# Patient Record
Sex: Female | Born: 1980 | Race: Black or African American | Hispanic: No | Marital: Married | Smoking: Never smoker
Health system: Southern US, Community
[De-identification: ages and names within clinical notes are randomized; demographics above are authoritative.]

## PROBLEM LIST (undated history)

## (undated) DIAGNOSIS — Z8719 Personal history of other diseases of the digestive system: Secondary | ICD-10-CM

## (undated) DIAGNOSIS — R51 Headache: Secondary | ICD-10-CM

## (undated) DIAGNOSIS — R519 Headache, unspecified: Secondary | ICD-10-CM

## (undated) DIAGNOSIS — M659 Synovitis and tenosynovitis, unspecified: Secondary | ICD-10-CM

## (undated) DIAGNOSIS — M25862 Other specified joint disorders, left knee: Secondary | ICD-10-CM

## (undated) DIAGNOSIS — M25869 Other specified joint disorders, unspecified knee: Secondary | ICD-10-CM

## (undated) DIAGNOSIS — Z8711 Personal history of peptic ulcer disease: Secondary | ICD-10-CM

## (undated) HISTORY — PX: UPPER GI ENDOSCOPY: SHX6162

---

## 1999-06-02 ENCOUNTER — Inpatient Hospital Stay (HOSPITAL_COMMUNITY): Admission: EM | Admit: 1999-06-02 | Discharge: 1999-06-04 | Payer: Self-pay | Admitting: Emergency Medicine

## 2002-04-08 ENCOUNTER — Emergency Department (HOSPITAL_COMMUNITY): Admission: EM | Admit: 2002-04-08 | Discharge: 2002-04-08 | Payer: Self-pay | Admitting: Emergency Medicine

## 2002-09-19 ENCOUNTER — Emergency Department (HOSPITAL_COMMUNITY): Admission: EM | Admit: 2002-09-19 | Discharge: 2002-09-20 | Payer: Self-pay

## 2002-09-20 ENCOUNTER — Ambulatory Visit (HOSPITAL_COMMUNITY): Admission: RE | Admit: 2002-09-20 | Discharge: 2002-09-20 | Payer: Self-pay

## 2003-01-10 ENCOUNTER — Encounter: Admission: RE | Admit: 2003-01-10 | Discharge: 2003-01-10 | Payer: Self-pay | Admitting: Family Medicine

## 2006-12-17 DIAGNOSIS — M545 Low back pain, unspecified: Secondary | ICD-10-CM | POA: Insufficient documentation

## 2007-07-02 ENCOUNTER — Emergency Department (HOSPITAL_COMMUNITY): Admission: EM | Admit: 2007-07-02 | Discharge: 2007-07-02 | Payer: Self-pay | Admitting: Family Medicine

## 2008-07-03 ENCOUNTER — Emergency Department (HOSPITAL_COMMUNITY): Admission: EM | Admit: 2008-07-03 | Discharge: 2008-07-03 | Payer: Self-pay | Admitting: Emergency Medicine

## 2009-07-17 ENCOUNTER — Emergency Department (HOSPITAL_COMMUNITY): Admission: EM | Admit: 2009-07-17 | Discharge: 2009-07-17 | Payer: Self-pay | Admitting: Family Medicine

## 2010-05-28 ENCOUNTER — Other Ambulatory Visit: Admission: RE | Admit: 2010-05-28 | Discharge: 2010-05-28 | Payer: Self-pay | Admitting: Obstetrics and Gynecology

## 2012-07-21 ENCOUNTER — Other Ambulatory Visit (HOSPITAL_COMMUNITY)
Admission: RE | Admit: 2012-07-21 | Discharge: 2012-07-21 | Disposition: A | Discharge status: Home / Self Care | Payer: Self-pay | Source: Ambulatory Visit | Attending: Obstetrics and Gynecology | Admitting: Obstetrics and Gynecology

## 2012-07-21 ENCOUNTER — Other Ambulatory Visit: Payer: Self-pay | Admitting: Obstetrics and Gynecology

## 2012-07-21 DIAGNOSIS — Z01419 Encounter for gynecological examination (general) (routine) without abnormal findings: Secondary | ICD-10-CM | POA: Insufficient documentation

## 2013-07-25 ENCOUNTER — Other Ambulatory Visit (HOSPITAL_COMMUNITY)
Admission: RE | Admit: 2013-07-25 | Discharge: 2013-07-25 | Disposition: A | Discharge status: Home / Self Care | Payer: Self-pay | Source: Ambulatory Visit | Attending: Obstetrics and Gynecology | Admitting: Obstetrics and Gynecology

## 2013-07-25 ENCOUNTER — Other Ambulatory Visit: Payer: Self-pay | Admitting: Obstetrics and Gynecology

## 2013-07-25 DIAGNOSIS — Z1151 Encounter for screening for human papillomavirus (HPV): Secondary | ICD-10-CM | POA: Insufficient documentation

## 2013-07-25 DIAGNOSIS — Z01419 Encounter for gynecological examination (general) (routine) without abnormal findings: Secondary | ICD-10-CM | POA: Insufficient documentation

## 2013-11-18 ENCOUNTER — Encounter (HOSPITAL_COMMUNITY): Payer: Self-pay | Admitting: Emergency Medicine

## 2013-11-18 ENCOUNTER — Emergency Department (HOSPITAL_COMMUNITY): Payer: Self-pay

## 2013-11-18 ENCOUNTER — Emergency Department (HOSPITAL_COMMUNITY)
Admission: EM | Admit: 2013-11-18 | Discharge: 2013-11-18 | Disposition: A | Discharge status: Home / Self Care | Payer: Self-pay | Attending: Emergency Medicine | Admitting: Emergency Medicine

## 2013-11-18 DIAGNOSIS — S93409A Sprain of unspecified ligament of unspecified ankle, initial encounter: Secondary | ICD-10-CM | POA: Insufficient documentation

## 2013-11-18 DIAGNOSIS — Y939 Activity, unspecified: Secondary | ICD-10-CM | POA: Insufficient documentation

## 2013-11-18 DIAGNOSIS — Y929 Unspecified place or not applicable: Secondary | ICD-10-CM | POA: Insufficient documentation

## 2013-11-18 DIAGNOSIS — S93402A Sprain of unspecified ligament of left ankle, initial encounter: Secondary | ICD-10-CM

## 2013-11-18 DIAGNOSIS — X58XXXA Exposure to other specified factors, initial encounter: Secondary | ICD-10-CM | POA: Insufficient documentation

## 2013-11-18 DIAGNOSIS — R209 Unspecified disturbances of skin sensation: Secondary | ICD-10-CM | POA: Insufficient documentation

## 2013-11-18 DIAGNOSIS — Z8719 Personal history of other diseases of the digestive system: Secondary | ICD-10-CM | POA: Insufficient documentation

## 2013-11-18 MED ORDER — HYDROCODONE-ACETAMINOPHEN 5-325 MG PO TABS
1.0000 | ORAL_TABLET | Freq: Once | ORAL | Status: AC
  Administered 2013-11-18: 1 via ORAL
  Filled 2013-11-18: qty 1

## 2013-11-18 MED ORDER — HYDROCODONE-ACETAMINOPHEN 5-325 MG PO TABS: 1.0000 | ORAL_TABLET | ORAL | Status: DC | PRN

## 2013-11-18 NOTE — Discharge Instructions (Signed)
Ankle Sprain °An ankle sprain is an injury to the strong, fibrous tissues (ligaments) that hold the bones of your ankle joint together.  °CAUSES °An ankle sprain is usually caused by a fall or by twisting your ankle. Ankle sprains most commonly occur when you step on the outer edge of your foot, and your ankle turns inward. People who participate in sports are more prone to these types of injuries.  °SYMPTOMS  °· Pain in your ankle. The pain may be present at rest or only when you are trying to stand or walk. °· Swelling. °· Bruising. Bruising may develop immediately or within 1 to 2 days after your injury. °· Difficulty standing or walking, particularly when turning corners or changing directions. °DIAGNOSIS  °Your caregiver will ask you details about your injury and perform a physical exam of your ankle to determine if you have an ankle sprain. During the physical exam, your caregiver will press on and apply pressure to specific areas of your foot and ankle. Your caregiver will try to move your ankle in certain ways. An X-ray exam may be done to be sure a bone was not broken or a ligament did not separate from one of the bones in your ankle (avulsion fracture).  °TREATMENT  °Certain types of braces can help stabilize your ankle. Your caregiver can make a recommendation for this. Your caregiver may recommend the use of medicine for pain. If your sprain is severe, your caregiver may refer you to a surgeon who helps to restore function to parts of your skeletal system (orthopedist) or a physical therapist. °HOME CARE INSTRUCTIONS  °· Apply ice to your injury for 1 2 days or as directed by your caregiver. Applying ice helps to reduce inflammation and pain. °· Put ice in a plastic bag. °· Place a towel between your skin and the bag. °· Leave the ice on for 15-20 minutes at a time, every 2 hours while you are awake. °· Only take over-the-counter or prescription medicines for pain, discomfort, or fever as directed by  your caregiver. °· Elevate your injured ankle above the level of your heart as much as possible for 2 3 days. °· If your caregiver recommends crutches, use them as instructed. Gradually put weight on the affected ankle. Continue to use crutches or a cane until you can walk without feeling pain in your ankle. °· If you have a plaster splint, wear the splint as directed by your caregiver. Do not rest it on anything harder than a pillow for the first 24 hours. Do not put weight on it. Do not get it wet. You may take it off to take a shower or bath. °· You may have been given an elastic bandage to wear around your ankle to provide support. If the elastic bandage is too tight (you have numbness or tingling in your foot or your foot becomes cold and blue), adjust the bandage to make it comfortable. °· If you have an air splint, you may blow more air into it or let air out to make it more comfortable. You may take your splint off at night and before taking a shower or bath. Wiggle your toes in the splint several times per day to decrease swelling. °SEEK MEDICAL CARE IF:  °· You have rapidly increasing bruising or swelling. °· Your toes feel extremely cold or you lose feeling in your foot. °· Your pain is not relieved with medicine. °SEEK IMMEDIATE MEDICAL CARE IF: °· Your toes are numb   or blue.  You have severe pain that is increasing. MAKE SURE YOU:   Understand these instructions.  Will watch your condition.  Will get help right away if you are not doing well or get worse. Document Released: 10/06/2005 Document Revised: 06/30/2012 Document Reviewed: 10/18/2011 [facility] Patient Information 2014 [place], [place].  Joint Sprain A sprain is a tear or stretch in the ligaments that hold a joint together. Severe sprains may need as long as 3-6 weeks of immobilization and/or exercises to heal completely. Sprained joints should be rested and protected. If not, they can become unstable and prone to re-injury. Proper  treatment can reduce your pain, shorten the period of disability, and reduce the risk of repeated injuries. TREATMENT   Rest and elevate the injured joint to reduce pain and swelling.  Apply ice packs to the injury for 20-30 minutes every 2-3 hours for the next 2-3 days.  Keep the injury wrapped in a compression bandage or splint as long as the joint is painful or as instructed by your caregiver.  Do not use the injured joint until it is completely healed to prevent re-injury and chronic instability. Follow the instructions of your caregiver.  Long-term sprain management may require exercises and/or treatment by a physical therapist. Taping or special braces may help stabilize the joint until it is completely better. SEEK MEDICAL CARE IF:   You develop increased pain or swelling of the joint.  You develop increasing redness and warmth of the joint.  You develop a fever.  It becomes stiff.  Your hand or foot gets cold or numb. Document Released: 11/13/2004 Document Revised: 12/29/2011 Document Reviewed: 10/23/2008 [facility] Patient Information 2014 [place]Care, [place].

## 2013-11-18 NOTE — ED Notes (Signed)
Pt reports increase in pain to left lateral ankle over past 2 months. No known injury but reports that she is on her feet a lot. Pt limps with ambulation and reports pain with palpation and movement of toes or foot.

## 2013-11-18 NOTE — ED Provider Notes (Signed)
CSN: [id number]     Arrival date & time 11/18/13  1904 History  This chart was scribed for non-physician practitioner Wylene Simmer, PA-C working with Juliet Rude. Rubin Payor, MD by Joaquin Music, ED Scribe. This patient was seen in room TR05C/TR05C and the patient's care was started at 7:58 PM     No chief complaint on file.  The history is provided by the patient. No language interpreter was used.   HPI Comments: Hannah Walton is a 33 y.o. female who presents to the Emergency Department complaining of ongoing worsening L ankle pain that began yesterday. Pt states she woke up yesterday morning with light pain and states the pain has gotten worse. Pt states she works in a [employer] and states she walks and stands for long durations at a time. She states she is unsure if working has caused her ankle pain but states she has had ankle pain prior but not to the extent of her current pain. Pt states she has been having numbness and tingling to her metatarsals today. She states she was unable to walk today while at work and states she elevated her ankle. She states she feels her "L ankle can give out on her at times". Pt denies any recent falls or injuries. Pt is unsure of hx of GOUT.  Past Medical History  Diagnosis Date  . Stomach ulcer    History reviewed. No pertinent past surgical history. No family history on file. History  Substance Use Topics  . Smoking status: Never Smoker   . Smokeless tobacco: Not on file  . Alcohol Use: No   OB History   Grav Para Term Preterm Abortions TAB SAB Ect Mult Living                 Review of Systems  All other systems reviewed and are negative.    Allergies  Asa and Ibuprofen  Home Medications   Current Outpatient Rx  Name  Route  Sig  Dispense  Refill  . ibuprofen (ADVIL,MOTRIN) 200 MG tablet   Oral   Take 400 mg by mouth daily as needed for mild pain.          BP 133/74  Pulse 77  Temp(Src) 97.8 F (36.6 C) (Oral)   Resp 16  Ht 5\' 2"  (1.575 m)  Wt 168 lb (76.204 kg)  BMI 30.72 kg/m2  SpO2 100%  LMP 11/16/2013  Physical Exam  Nursing note and vitals reviewed. Constitutional: She is oriented to person, place, and time. She appears well-developed and well-nourished. No distress.  HENT:  Head: Normocephalic and atraumatic.  Mouth/Throat: Oropharynx is clear and moist.  Eyes: Conjunctivae are normal. No scleral icterus.  Cardiovascular: Intact distal pulses.   Pulmonary/Chest: Effort normal.  Musculoskeletal:       Left ankle: She exhibits decreased range of motion and swelling. She exhibits no deformity and normal pulse. Tenderness. Lateral malleolus tenderness found. No proximal fibula tenderness found. Achilles tendon normal.       Feet:  Neurological: She is alert and oriented to person, place, and time. She exhibits normal muscle tone. Coordination normal.  Skin: Skin is warm and dry. No rash noted. No erythema. No pallor.  Psychiatric: She has a normal mood and affect. Her behavior is normal. Judgment and thought content normal.    ED Course  Procedures DIAGNOSTIC STUDIES: Oxygen Saturation is 100% on RA, normal by my interpretation.    COORDINATION OF CARE: 8:04 PM-Discussed treatment plan which includes X-ray  L ankle. Pt agreed to plan.   9:34 PM-Discussed radiology findings with pt. Will discharge pt with ankle mobilizer, pain medication and advised pt to F/U with PCP. Pt agreed to plan.  Labs Review Labs Reviewed - No data to display Imaging Review Dg Ankle Complete Left  11/18/2013   CLINICAL DATA:  Pain and swelling lateral malleolus worse over the past week with no trauma  EXAM: LEFT ANKLE COMPLETE - 3+ VIEW  COMPARISON:  None.  FINDINGS: There is lateral soft tissue swelling. There is an ossicle off of the tip of the lateral malleolus. The mortise is intact. There is no fracture or dislocation. There is mild tibiotalar arthritis. There is no significant joint effusion.   IMPRESSION: Nonspecific findings including mild lateral soft tissue swelling and mild arthritis. Tiny bone fragment off of the lateral malleolus tip appears most consistent with a developmental ossicle.   Electronically Signed   By: Esperanza Heiraymond  Rubner M.D.   On: 11/18/2013 20:35    EKG Interpretation   None      MDM  Left ankle sprain  Patient here with left ankle pain over the past 3 days with swelling - no known injury but the ankle appears to be sprained.  Placed in ASO and pain control and referral to ortho if no improvement in 1 week.  I personally performed the services described in this documentation, which was scribed in my presence. The recorded information has been reviewed and is accurate.    Izola PriceFrances C. Marisue HumbleSanford, Cordelia Poche-C 11/18/13 2139

## 2013-11-19 ENCOUNTER — Emergency Department (HOSPITAL_COMMUNITY)
Admission: EM | Admit: 2013-11-19 | Discharge: 2013-11-19 | Disposition: A | Discharge status: Home / Self Care | Payer: Self-pay | Attending: Emergency Medicine | Admitting: Emergency Medicine

## 2013-11-19 ENCOUNTER — Encounter (HOSPITAL_COMMUNITY): Payer: Self-pay | Admitting: Emergency Medicine

## 2013-11-19 DIAGNOSIS — Z8711 Personal history of peptic ulcer disease: Secondary | ICD-10-CM | POA: Insufficient documentation

## 2013-11-19 DIAGNOSIS — M1990 Unspecified osteoarthritis, unspecified site: Secondary | ICD-10-CM

## 2013-11-19 DIAGNOSIS — M129 Arthropathy, unspecified: Secondary | ICD-10-CM | POA: Insufficient documentation

## 2013-11-19 MED ORDER — OXYCODONE-ACETAMINOPHEN 5-325 MG PO TABS
1.0000 | ORAL_TABLET | Freq: Once | ORAL | Status: AC
  Administered 2013-11-19: 1 via ORAL
  Filled 2013-11-19: qty 1

## 2013-11-19 MED ORDER — OXYCODONE-ACETAMINOPHEN 5-325 MG PO TABS: 1.0000 | ORAL_TABLET | ORAL | Status: DC | PRN

## 2013-11-19 NOTE — ED Provider Notes (Signed)
Medical screening examination/treatment/procedure(s) were conducted as a shared visit with non-physician practitioner(s) and myself.  I personally evaluated the patient during the encounter.  EKG Interpretation   None      This appears to be some sort of inflammatory arthritis of her left ankle.  My suspicion for septic arthritis is low.  Outpatient followup.  Symptomatic control.  I do not believe there is enough of the left ankle effusion is time to obtain an appropriate arthrocentesis   Lyanne CoKevin M Daniele Yankowski, MD 11/19/13 2304

## 2013-11-19 NOTE — Discharge Instructions (Signed)

## 2013-11-19 NOTE — ED Notes (Signed)
Pt c/o left side ankle pain. [facility]. States she was diagnosed with sprain, and medication for pain control makes her sick.

## 2013-11-19 NOTE — ED Provider Notes (Signed)
CSN: [id number]     Arrival date & time 11/19/13  2053 History   None    This chart was scribed for non-physician practitioner, Cherrie Distance PA-C, working with Lyanne Co, MD by Arlan Organ, ED Scribe. This patient was seen in room TR09C/TR09C and the patient's care was started at 9:16 PM.   Chief Complaint  Patient presents with  . Ankle Pain   The history is provided by the patient. No language interpreter was used.    HPI Comments: Hannah Walton is a 33 y.o. female who presents to the Emergency Department complaining of gradually worsening, severe left sided ankle pain she described as "razors" that initially started 2 days ago. She has noted gradually worsening pain, and new erythema to the area. She denies any noticeable temporary improvement since onset. She states palpation to the area and weight bearing exacerbates her discomfort. Denies any alleviating factors at this time. Pt was previously seen here in the ED yesterday 1/30 for the same complaint, and was placed in an ASO, and treated with pain medication. She reports standing for long periods of time at work, but is unaware if she associates this with her current pain. Pt has no other pertinent medical history, and no other complaints.   Past Medical History  Diagnosis Date  . Stomach ulcer    History reviewed. No pertinent past surgical history. History reviewed. No pertinent family history. History  Substance Use Topics  . Smoking status: Never Smoker   . Smokeless tobacco: Never Used  . Alcohol Use: No   OB History   Grav Para Term Preterm Abortions TAB SAB Ect Mult Living                 Review of Systems  Musculoskeletal: Positive for arthralgias (Left sided ankle pain).  All other systems reviewed and are negative.    Allergies  Asa and Ibuprofen  Home Medications   Current Outpatient Rx  Name  Route  Sig  Dispense  Refill  . HYDROcodone-acetaminophen (NORCO/VICODIN) 5-325 MG per tablet    Oral   Take 1 tablet by mouth every 4 (four) hours as needed for moderate pain.   30 tablet   0   . ibuprofen (ADVIL,MOTRIN) 200 MG tablet   Oral   Take 400 mg by mouth daily as needed for mild pain.          Triage Vitals: BP 123/74  Pulse 95  Temp(Src) 99.3 F (37.4 C) (Oral)  Resp 18  SpO2 100%  LMP 11/16/2013  Physical Exam  Nursing note and vitals reviewed. Constitutional: She is oriented to person, place, and time. She appears well-developed and well-nourished.  HENT:  Head: Normocephalic and atraumatic.  Eyes: EOM are normal.  Neck: Normal range of motion.  Cardiovascular: Normal rate.   Pulmonary/Chest: Effort normal.  Musculoskeletal: She exhibits edema and tenderness.       Right ankle: She exhibits normal range of motion, no swelling and normal pulse. No tenderness. No lateral malleolus and no medial malleolus tenderness found.       Left ankle: She exhibits decreased range of motion and swelling. She exhibits no ecchymosis and normal pulse. Tenderness. Lateral malleolus and medial malleolus tenderness found. Achilles tendon normal.       Feet:  Erythema noted to lateral aspect of the left ankle  Neurological: She is alert and oriented to person, place, and time. She exhibits normal muscle tone.  Skin: Skin is warm and dry.  Psychiatric: She has a normal mood and affect. Her behavior is normal.    ED Course  Procedures (including critical care time)  DIAGNOSTIC STUDIES: Oxygen Saturation is 100% on RA, [place] by my interpretation.    COORDINATION OF CARE: 10:12 PM- Will consult with Dr. Patria Maneampos for possible aspiration. Discussed treatment plan with pt at bedside and pt agreed to plan.     Labs Review Labs Reviewed - No data to display Imaging Review Dg Ankle Complete Left  11/18/2013   CLINICAL DATA:  Pain and swelling lateral malleolus worse over the past week with no trauma  EXAM: LEFT ANKLE COMPLETE - 3+ VIEW  COMPARISON:  None.  FINDINGS: There is  lateral soft tissue swelling. There is an ossicle off of the tip of the lateral malleolus. The mortise is intact. There is no fracture or dislocation. There is mild tibiotalar arthritis. There is no significant joint effusion.  IMPRESSION: Nonspecific findings including mild lateral soft tissue swelling and mild arthritis. Tiny bone fragment off of the lateral malleolus tip appears most consistent with a developmental ossicle.   Electronically Signed   By: Esperanza Heiraymond  Rubner M.D.   On: 11/18/2013 20:35    EKG Interpretation   None       MDM  Left ankle pain  Patient returns today with continued left ankle pain now with redness, I have asked Dr. Patria Maneampos to see this patient with me and we agree that this is more inflammatory in nature similar to gout or another inflammatory joint condition.  This does warrant further evaluation by her PCP to draw blood.  We do not believe this to be septic arthritis as the patient has no risk factors including recent surgery, joint replacement, IV drug use, history of gonococcal infection.  We believe there is a small joint effusion but likely not enough to tap the joint and sent the synovial fluid for crystals and other evaluation.  I personally performed the services described in this documentation, which was scribed in my presence. The recorded information has been reviewed and is accurate.    Izola PriceFrances C. Marisue HumbleSanford, [place] JerseyPA-C 11/19/13 2301

## 2013-11-20 NOTE — ED Provider Notes (Signed)
Medical screening examination/treatment/procedure(s) were performed by non-physician practitioner and as supervising physician I was immediately available for consultation/collaboration.  EKG Interpretation   None        Ashlee Bewley R. Wanell Lorenzi, MD 11/20/13 0747 

## 2014-01-24 ENCOUNTER — Ambulatory Visit: Payer: BC Managed Care – PPO

## 2014-01-24 ENCOUNTER — Ambulatory Visit (INDEPENDENT_AMBULATORY_CARE_PROVIDER_SITE_OTHER): Payer: BC Managed Care – PPO | Admitting: Family Medicine

## 2014-01-24 VITALS — BP 120/80 | HR 76 | Temp 98.2°F | Resp 16 | Ht 64.0 in | Wt 176.0 lb

## 2014-01-24 DIAGNOSIS — M25569 Pain in unspecified knee: Secondary | ICD-10-CM

## 2014-01-24 DIAGNOSIS — M25562 Pain in left knee: Secondary | ICD-10-CM

## 2014-01-24 DIAGNOSIS — D649 Anemia, unspecified: Secondary | ICD-10-CM

## 2014-01-24 DIAGNOSIS — M79662 Pain in left lower leg: Secondary | ICD-10-CM

## 2014-01-24 DIAGNOSIS — M79609 Pain in unspecified limb: Secondary | ICD-10-CM

## 2014-01-24 LAB — POCT CBC
Granulocyte percent: 39.6 %G (ref 37–80)
HEMATOCRIT: 36.1 % — AB (ref 37.7–47.9)
HEMOGLOBIN: 11.3 g/dL — AB (ref 12.2–16.2)
Lymph, poc: 3.3 (ref 0.6–3.4)
MCH, POC: 26.4 pg — AB (ref 27–31.2)
MCHC: 31.3 g/dL — AB (ref 31.8–35.4)
MCV: 84.4 fL (ref 80–97)
MID (cbc): 0.5 (ref 0–0.9)
MPV: 9.8 fL (ref 0–99.8)
POC Granulocyte: 2.5 (ref 2–6.9)
POC LYMPH %: 52.5 % — AB (ref 10–50)
POC MID %: 7.9 %M (ref 0–12)
Platelet Count, POC: 272 10*3/uL (ref 142–424)
RBC: 1.28 M/uL — AB (ref 4.04–5.48)
RDW, POC: 14.3 %
WBC: 6.2 10*3/uL (ref 4.6–10.2)

## 2014-01-24 MED ORDER — DICLOFENAC SODIUM 75 MG PO TBEC: 75.0000 mg | DELAYED_RELEASE_TABLET | Freq: Two times a day (BID) | ORAL | Status: DC

## 2014-01-24 NOTE — Addendum Note (Signed)
Addended by: Johnnette LitterARDWELL, Mario Voong M on: 01/24/2014 09:35 PM   Modules accepted: Orders

## 2014-01-24 NOTE — Patient Instructions (Addendum)
We will schedule you for an ultrasound of the calf to make sure there is no chronic clotting there.  Assuming that is okay start doing some daily walking  Take diclofenac one twice daily for pain and inflammation in the calf and knee  If you ever get acutely worse please return  Take iron one daily with food for the anemia. Cause of your history of an ulcer wait until you're finished the diclofenac before starting on the iron. After about 3 months she should return for a recheck of your blood count

## 2014-01-24 NOTE — Progress Notes (Signed)
Subjective: Patient is here with history of having had pains in her left leg for the past 6 months. She works at a nursing care facility. She is on her feet for long hours all day. When she is working for long hours she develops pain in the knee. He gets stiff. No crepitance. She aches in the calf of the left leg, primarily in the posterior aspect. She also has a little bit of swelling just to the left ankle which she's been on her feet a lot. No specific injury. About 3 months ago she was having a lot of trouble with pain in the left ankle and went to the emergency room. X-ray was done and showed mild arthritic changes but nothing real specific. She's not pregnant, last missed period was about 3 weeks ago. She has no problems in the other leg.  Objective: Pleasant young lady in no major distress. He has no swelling or effusion palpable. There is good range of motion of the knee without crepitance. The calf is not tender to night, but was badly she was on it all day. The ankle is unremarkable with good range of motion. No tenderness. There is no edema today. Good pedal pulse.  Assessment: Left knee and calf pain, etiology unclear  Plan: X-ray left knee CBC and sedimentation rate   Results for orders placed in visit on 01/24/14  POCT CBC      Result Value Ref Range   WBC 6.2  4.6 - 10.2 K/uL   Lymph, poc 3.3  0.6 - 3.4   POC LYMPH PERCENT 52.5 (*) 10 - 50 %L   MID (cbc) 0.5  0 - 0.9   POC MID % 7.9  0 - 12 %M   POC Granulocyte 2.5  2 - 6.9   Granulocyte percent 39.6  37 - 80 %G   RBC 1.28 (*) 4.04 - 5.48 M/uL   Hemoglobin 11.3 (*) 12.2 - 16.2 g/dL   HCT, POC 45.436.1 (*) 09.837.7 - 47.9 %   MCV 84.4  80 - 97 fL   MCH, POC 26.4 (*) 27 - 31.2 pg   MCHC 31.3 (*) 31.8 - 35.4 g/dL   RDW, POC 11.914.3     Platelet Count, POC 272  142 - 424 K/uL   MPV 9.8  0 - 99.8 fL   [facility] reading (PRIMARY) by  Dr. Alwyn RenHopper Normal knee  Take iron for the anemia and get rechecked this summer Diclofenac one twice  daily. She has a history of an ulcer many years ago. If she gets any stomach irritation she is to discontinue it. .Marland Kitchen

## 2014-01-25 LAB — SEDIMENTATION RATE: SED RATE: 5 mm/h (ref 0–22)

## 2014-02-02 ENCOUNTER — Ambulatory Visit (HOSPITAL_COMMUNITY): Payer: BC Managed Care – PPO | Attending: Family Medicine | Admitting: Cardiology

## 2014-02-02 DIAGNOSIS — M7989 Other specified soft tissue disorders: Secondary | ICD-10-CM

## 2014-02-02 DIAGNOSIS — M79609 Pain in unspecified limb: Secondary | ICD-10-CM

## 2014-02-02 DIAGNOSIS — M79662 Pain in left lower leg: Secondary | ICD-10-CM

## 2014-02-02 NOTE — Progress Notes (Signed)
Left lower extremity venous duplex performed  

## 2014-03-06 ENCOUNTER — Ambulatory Visit: Payer: Self-pay | Admitting: Family Medicine

## 2014-03-07 ENCOUNTER — Ambulatory Visit (INDEPENDENT_AMBULATORY_CARE_PROVIDER_SITE_OTHER): Payer: BC Managed Care – PPO | Admitting: Family Medicine

## 2014-03-07 ENCOUNTER — Encounter: Payer: Self-pay | Admitting: Family Medicine

## 2014-03-07 ENCOUNTER — Ambulatory Visit (INDEPENDENT_AMBULATORY_CARE_PROVIDER_SITE_OTHER)
Admission: RE | Admit: 2014-03-07 | Discharge: 2014-03-07 | Disposition: A | Discharge status: Home / Self Care | Payer: BC Managed Care – PPO | Source: Ambulatory Visit | Attending: Family Medicine | Admitting: Family Medicine

## 2014-03-07 VITALS — BP 118/72 | HR 71 | Temp 98.5°F | Ht 62.5 in | Wt 173.0 lb

## 2014-03-07 DIAGNOSIS — K5909 Other constipation: Secondary | ICD-10-CM

## 2014-03-07 DIAGNOSIS — M79609 Pain in unspecified limb: Secondary | ICD-10-CM

## 2014-03-07 DIAGNOSIS — M79605 Pain in left leg: Secondary | ICD-10-CM

## 2014-03-07 DIAGNOSIS — K5900 Constipation, unspecified: Secondary | ICD-10-CM

## 2014-03-07 DIAGNOSIS — M545 Low back pain, unspecified: Secondary | ICD-10-CM

## 2014-03-07 NOTE — Assessment & Plan Note (Signed)
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

## 2014-03-07 NOTE — Progress Notes (Signed)
Subjective:    Patient ID: Hannah Walton, female    DOB: 04/08/1981, 33 y.o.   MRN: [id number]  HPI Used to go to [facility] and lives closer so here to establish   S[facility] Dr Alwyn RenHopper [facility]  In April  L leg pain  xr knee-nl  xr ankle - with mild degenerative change [place] leg -normal   She still has leg pain - from the knee to the back of calf and down to her foot  It gets better when she is off of it for 2-3 d  Starts back up at work  Got some insoles in shoes and changed her shoes-not a lot of improvement  She does have back pain quite a bit - in low back    Chronic constipation - most of her life  Has changed diet - and eating more fiber Drinks water all the time  No improvement  Drinking some smoothies also    Gave voltaren for that - she tried it and it did not improve   Lab Results  Component Value Date   WBC 6.2 01/24/2014   HGB 11.3* 01/24/2014   HCT 36.1* 01/24/2014   MCV 84.4 01/24/2014    Lab Results  Component Value Date   ESRSEDRATE 5 01/24/2014   unsure if she is anemic from periods  Periods are regular  Not on any OC -she did not tolerate it - needs to call her gyn    Pap was 7/14  Gyn - Dr Richardson Doppole    Due to get Td at work   Patient Active Problem List   Diagnosis Date Noted  . BACK PAIN, LOW 12/17/2006   Past Medical History  Diagnosis Date  . Stomach ulcer   . History of arthritis   . History of bloody stools   . History of chicken pox   . History of headache   . History of migraine   . History of urinary incontinence    No past surgical history on file. History  Substance Use Topics  . Smoking status: Never Smoker   . Smokeless tobacco: Never Used  . Alcohol Use: No   Family History  Problem Relation Age of Onset  . Diabetes Maternal Grandmother   . Arthritis Mother   . Arthritis Paternal Grandmother   . Stroke Father   . Stroke Paternal Uncle   . Diabetes Maternal Grandmother   . Diabetes Paternal Uncle     Allergies  Allergen Reactions  . Asa [Aspirin]     Hx stomach ulcer  . Ibuprofen     Hx stomach ulcers   No current outpatient prescriptions on file prior to visit.   No current facility-administered medications on file prior to visit.    Review of Systems Review of Systems  Constitutional: Negative for fever, appetite change, fatigue and unexpected weight change.  Eyes: Negative for pain and visual disturbance.  Respiratory: Negative for cough and shortness of breath.   Cardiovascular: Negative for cp or palpitations    Gastrointestinal: Negative for nausea, diarrhea and pos for constipation. pos for hx of hemorrhoids  Genitourinary: Negative for urgency and frequency.  Skin: Negative for pallor or rash   MSK pos for L leg pain and low back pain  Neurological: Negative for weakness, light-headedness, numbness and headaches.  Hematological: Negative for adenopathy. Does not bruise/bleed easily.  Psychiatric/Behavioral: Negative for dysphoric mood. The patient is not nervous/anxious.         Objective:  Physical Exam  Constitutional: She appears well-developed and well-nourished. No distress.  obese and well appearing   HENT:  Head: Normocephalic and atraumatic.  Right Ear: External ear normal.  Left Ear: External ear normal.  Nose: Nose normal.  Mouth/Throat: Oropharynx is clear and moist.  Eyes: Conjunctivae and EOM are normal. Pupils are equal, round, and reactive to light. Right eye exhibits no discharge. Left eye exhibits no discharge. No scleral icterus.  Neck: Normal range of motion. Neck supple. No JVD present. No thyromegaly present.  Cardiovascular: Normal rate, regular rhythm, normal heart sounds and intact distal pulses.  Exam reveals no gallop.   Pulmonary/Chest: Effort normal and breath sounds normal. No respiratory distress. She has no wheezes. She has no rales.  Abdominal: Soft. Bowel sounds are normal. She exhibits no distension and no mass. There is no  tenderness.  Musculoskeletal: She exhibits no edema and no tenderness.       Lumbar back: She exhibits normal range of motion, no tenderness, no edema, no deformity and no spasm.  Neg SLR Nl rom L leg and hip  No leg tenderness or palp cords or swelling   Lymphadenopathy:    She has no cervical adenopathy.  Neurological: She is alert. She has normal reflexes. No cranial nerve deficit. She exhibits normal muscle tone. Coordination normal.  Skin: Skin is warm and dry. No rash noted. No erythema. No pallor.  Psychiatric: She has a normal mood and affect.          Assessment & Plan:

## 2014-03-07 NOTE — Assessment & Plan Note (Signed)
Poss radiculopathy to L leg xr today

## 2014-03-07 NOTE — Assessment & Plan Note (Signed)
With nl xray of knee/ mild deg change in ankle and neg venous doppler  Nl exam today Disc poss of radicular pain (with hx of intermittent low back pain ) Xray LS today and update

## 2014-03-07 NOTE — Patient Instructions (Signed)
For constipation - try miralax every day until bowel movements become more regular -you should be able to figure out what schedule you will need (even twice daily is ok) Continue lots of fruit and vegetable and water  Continue exercise  Get your tetanus shot at work  Xray of low back today-we will call you with a result - I wonder if you have a pinched nerve in the back that is causing leg pain

## 2014-03-07 NOTE — Progress Notes (Signed)
Pre visit review using our clinic review tool, if applicable. No additional management support is needed unless otherwise documented below in the visit note. 

## 2014-03-09 ENCOUNTER — Telehealth: Payer: Self-pay | Admitting: *Deleted

## 2014-03-09 NOTE — Telephone Encounter (Signed)
It will not hurt just to get one - she can make nurse appt for Tdap at her convenience

## 2014-03-09 NOTE — Telephone Encounter (Signed)
Pt called and said she check with her job and she didn't get a Td vaccine they were only doing TB skin test, pt not sure when the last Td vaccine her or her husband had and wanted to know if they should scheduled an appt. to get the vaccine, or wait until you get records from their old doctor's office but she said she knows it has been a while

## 2014-03-14 NOTE — Telephone Encounter (Signed)
Left voicemail letting pt know she can call office back and schedule a nurse appt. to get Tdap vaccine at her convenience

## 2014-03-21 ENCOUNTER — Ambulatory Visit (INDEPENDENT_AMBULATORY_CARE_PROVIDER_SITE_OTHER): Payer: BC Managed Care – PPO | Admitting: *Deleted

## 2014-03-21 DIAGNOSIS — Z23 Encounter for immunization: Secondary | ICD-10-CM

## 2014-03-28 ENCOUNTER — Institutional Professional Consult (permissible substitution): Payer: BC Managed Care – PPO | Admitting: Family Medicine

## 2014-04-03 ENCOUNTER — Ambulatory Visit (INDEPENDENT_AMBULATORY_CARE_PROVIDER_SITE_OTHER): Payer: BC Managed Care – PPO | Admitting: Family Medicine

## 2014-04-03 ENCOUNTER — Encounter: Payer: Self-pay | Admitting: Family Medicine

## 2014-04-03 VITALS — BP 108/70 | HR 60 | Temp 98.4°F | Ht 62.5 in | Wt 174.5 lb

## 2014-04-03 DIAGNOSIS — M25569 Pain in unspecified knee: Secondary | ICD-10-CM

## 2014-04-03 DIAGNOSIS — M2392 Unspecified internal derangement of left knee: Secondary | ICD-10-CM

## 2014-04-03 DIAGNOSIS — M5416 Radiculopathy, lumbar region: Secondary | ICD-10-CM

## 2014-04-03 DIAGNOSIS — M2390 Unspecified internal derangement of unspecified knee: Secondary | ICD-10-CM

## 2014-04-03 DIAGNOSIS — M25562 Pain in left knee: Secondary | ICD-10-CM

## 2014-04-03 DIAGNOSIS — IMO0002 Reserved for concepts with insufficient information to code with codable children: Secondary | ICD-10-CM

## 2014-04-03 MED ORDER — AMITRIPTYLINE HCL 25 MG PO TABS: 25.0000 mg | ORAL_TABLET | Freq: Every day | ORAL | Status: DC

## 2014-04-03 NOTE — Progress Notes (Signed)
Pre visit review using our clinic review tool, if applicable. No additional management support is needed unless otherwise documented below in the visit note. 

## 2014-04-03 NOTE — Progress Notes (Signed)
[address] [place] [address] Phone: [phone] Fax: [phone]  Patient ID: Hannah Walton MRN: [id number], DOB: 09/03/1981, 33 y.o. Date of Encounter: 04/03/2014  Primary Physician:  Roxy MannsMarne Tower, MD   Chief Complaint: Leg Pain   Subjective:   Dear Dr. Milinda Antisower,  Thank you for having me see Hannah Walton in consultation today at [facility] for her problem with LEFT LEG PAIN.  As you may recall, she is a [age] y.o. year old female with a history of left leg pain has been ongoing for approximately 2 months. 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 [facility], and Dr. Alwyn RenHopper did some ankle x-rays as well as a left knee film.  I reviewed the films, and her left AP and lateral films that were non-weightbearing are grossly unremarkable. There is no sunrise or merchant view.  She also very recently had an additional set of some lumbar spine films, which were grossly unremarkable also without significant spondyloarthropathy.  She tried a number of medications, but all the meds made her feel fairly well, and she recently had some nausea with oral Voltaren, so she stopped this.  She describes pain and is mostly having her pain specifically in and around the knee joint itself right now, but she does have some pain that will radiate down the leg and down part of the lower extremity, but it does not include the foot.  She has no known history of trauma or prior operative intervention.   Past Medical History  Diagnosis Date  . Stomach ulcer   . History of arthritis   . History of bloody stools   . History of chicken pox   . History of headache   . History of migraine   . History of urinary incontinence     No past surgical history on file.  History   Social History  . Marital Status: Married    Spouse Name: N/A    Number of  Children: N/A  . Years of Education: N/A   Social History Main Topics  . Smoking status: Never Smoker   . Smokeless tobacco: Never Used  . Alcohol Use: No  . Drug Use: No  . Sexual Activity: None   Other Topics Concern  . None   Social History Narrative  . None    Family History  Problem Relation Age of Onset  . Diabetes Maternal Grandmother   . Arthritis Mother   . Arthritis Paternal Grandmother   . Stroke Father   . Stroke Paternal Uncle   . Diabetes Maternal Grandmother   . Diabetes Paternal Uncle     Medications and Allergies reviewed  Review of Systems:    GEN: No fevers, chills. Nontoxic. Primarily MSK c/o today. MSK: Detailed in the HPI GI: tolerating PO intake without difficulty Neuro: as above Otherwise, the pertinent positives and negatives are listed above and in the HPI, otherwise a full review of systems has been reviewed and is negative unless noted positive.   Objective:   Physical Examination: BP 108/70  Pulse 60  Temp(Src) 98.4 F (36.9 C) (Oral)  Ht 5' 2.5" (1.588 m)  Wt 174 lb 8 oz (79.153 kg)  BMI 31.39 kg/m2  LMP 04/02/2014    GEN: WDWN, NAD, Non-toxic, Alert & Oriented x 3 HEENT: Atraumatic, Normocephalic.  Ears and Nose: No  external deformity. EXTR: No clubbing/cyanosis/edema NEURO: Normal gait.  PSYCH: Normally interactive. Conversant. Not depressed or anxious appearing.  Calm demeanor.    Left knee: No effusion. Extension to 0. Flexion to 120. Minimal to mild tenderness on the medial joint line. Nontender on the lateral joint line. Stable Lachman exam. Anterior cruciate ligament, PCL, MCL, and LCL are intact. McMurray's causes significant pain but no mechanical symptoms. Bounce home is positive and flexion pinch is positive.  No significant pain with patellar manipulation.  Bilateral hips had excellent range of motion and have no pain with terminal motion. Nontender significantly and the lumbar spine region. There some worsening  of some back pain with straight leg raise on the left, but no true radicular symptoms. Otherwise neurovascularly intact.  Radiology: Dg Lumbar Spine Complete  03/07/2014   CLINICAL DATA:  Intermittent low back and LEFT lower leg pain question radiculopathy, lumbago  EXAM: LUMBAR SPINE - COMPLETE 4+ VIEW  COMPARISON:  None  FINDINGS: Osseous mineralization normal.  Five non-rib bearing lumbar vertebrae.  Vertebral body and disc space heights maintained.  No acute fracture, subluxation or bone destruction.  No spondylolysis.  SI joints symmetric.  IMPRESSION: Normal exam.   Electronically Signed   By: Ulyses Southward M.D.   On: 03/07/2014 16:36   EXAM: LEFT ANKLE COMPLETE - 3+ VIEW   COMPARISON:  None.   FINDINGS: There is lateral soft tissue swelling. There is an ossicle off of the tip of the lateral malleolus. The mortise is intact. There is no fracture or dislocation. There is mild tibiotalar arthritis. There is no significant joint effusion.   IMPRESSION: Nonspecific findings including mild lateral soft tissue swelling and mild arthritis. Tiny bone fragment off of the lateral malleolus tip appears most consistent with a developmental ossicle.     Electronically Signed   By: Esperanza Heir M.D.   On: 11/18/2013 20:35 EXAM: LEFT KNEE - 1-2 VIEW   COMPARISON:  None.   FINDINGS: Imaged bones, joints and soft tissues appear normal.   IMPRESSION: Negative exam.     Electronically Signed   By: Drusilla Kanner M.D.   On: 01/24/2014 21:31  Assessment & Plan:   Left knee pain  Internal derangement of left knee  Lumbar radiculopathy  I think there are 2 distinct pathologies here. The patient's knee, at age 64, it is more likely to be a internal derangement, and she may have a meniscal tear. Hopefully, she is only having some synovitis and inflammation, and going to inject her knee and try to calm her down and reevaluate in one month.  She is having some intermittent pain in  her leg it would not be consistent with knee pain, and would be more consistent with nerve irritation and radiculopathy on the left side of her spine. We are going to initiate some neuropathic pain medications with close followup.  I appreciate the opportunity to evaluate this very friendly patient. If you have any question regarding her care or prognosis, do not hesitate to ask.   New Prescriptions   AMITRIPTYLINE (ELAVIL) 25 MG TABLET    Take 1 tablet (25 mg total) by mouth at bedtime.   Follow-up: 1 mo Unless noted above, the patient is to follow-up if symptoms worsen. Red flags were reviewed with the patient.  Signed,  Elpidio Galea. Saadiq Poche, MD, CAQ Sports Medicine   Discontinued Medications   No medications on file   Current Medications at Discharge:   Medication List  This list is accurate as of: 04/03/14  2:11 PM.  Always use your most recent med list.               amitriptyline 25 MG tablet  Commonly known as:  ELAVIL  Take 1 tablet (25 mg total) by mouth at bedtime.

## 2014-04-04 ENCOUNTER — Telehealth: Payer: Self-pay | Admitting: *Deleted

## 2014-04-04 NOTE — Telephone Encounter (Signed)
Pt contacted office stating that she received a written Rx on yesterday but there was no quantity written in. Rx called in to pts requested pharmacy

## 2014-05-03 ENCOUNTER — Ambulatory Visit: Payer: BC Managed Care – PPO | Admitting: Family Medicine

## 2014-05-10 ENCOUNTER — Ambulatory Visit: Payer: BC Managed Care – PPO | Admitting: Family Medicine

## 2014-05-16 ENCOUNTER — Encounter: Payer: Self-pay | Admitting: Family Medicine

## 2014-05-16 ENCOUNTER — Ambulatory Visit (INDEPENDENT_AMBULATORY_CARE_PROVIDER_SITE_OTHER): Payer: BC Managed Care – PPO | Admitting: Family Medicine

## 2014-05-16 VITALS — BP 100/70 | HR 81 | Temp 98.4°F | Ht 62.5 in | Wt 173.2 lb

## 2014-05-16 DIAGNOSIS — M5416 Radiculopathy, lumbar region: Secondary | ICD-10-CM

## 2014-05-16 DIAGNOSIS — M2392 Unspecified internal derangement of left knee: Secondary | ICD-10-CM

## 2014-05-16 DIAGNOSIS — M2390 Unspecified internal derangement of unspecified knee: Secondary | ICD-10-CM

## 2014-05-16 DIAGNOSIS — IMO0002 Reserved for concepts with insufficient information to code with codable children: Secondary | ICD-10-CM

## 2014-05-16 NOTE — Progress Notes (Signed)
Pre visit review using our clinic review tool, if applicable. No additional management support is needed unless otherwise documented below in the visit note. 

## 2014-05-16 NOTE — Progress Notes (Signed)
[address] [place] [address] Phone: [phone] Fax: [phone]  Patient ID: Hannah Walton MRN: [id number], DOB: 01/11/1981, 33 y.o. Date of Encounter: 05/16/2014  Primary Physician:  Roxy MannsMarne Tower, MD   Chief Complaint: Follow-up   Subjective:   Better. Last time I felt like she probably had left knee joint irritation and synovitis versus internal derangement, and we did an intra-articular injection of the left knee. Also started her on Elavil for some lumbar radiculopathy. At this point, she is now basically completely asymptomatic.  04/03/2014 Last OV with Hannah BeatSpencer Hilary Milks, MD  Thank you for having me see Andria L Walton in consultation today at [facility] for her problem with LEFT LEG PAIN.  As you may recall, she is a 33 y.o. year old female with a history of left leg pain has been ongoing for approximately 2 months. 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 [facility], and Dr. Alwyn RenHopper did some ankle x-rays as well as a left knee film.  I reviewed the films, and her left AP and lateral films that were non-weightbearing are grossly unremarkable. There is no sunrise or merchant view.  She also very recently had an additional set of some lumbar spine films, which were grossly unremarkable also without significant spondyloarthropathy.  She tried a number of medications, but all the meds made her feel fairly well, and she recently had some nausea with oral Voltaren, so she stopped this.  She describes pain and is mostly having her pain specifically in and around the knee joint itself right now, but she does have some pain that will radiate down the leg and down part of the lower extremity, but it does not include the foot.  She has no known history of trauma or prior operative intervention.   Past Medical History  Diagnosis Date  .  Stomach ulcer   . History of arthritis   . History of bloody stools   . History of chicken pox   . History of headache   . History of migraine   . History of urinary incontinence     No past surgical history on file.  History   Social History  . Marital Status: Married    Spouse Name: N/A    Number of Children: N/A  . Years of Education: N/A   Social History Main Topics  . Smoking status: Never Smoker   . Smokeless tobacco: Never Used  . Alcohol Use: No  . Drug Use: No  . Sexual Activity: None   Other Topics Concern  . None   Social History Narrative  . None    Family History  Problem Relation Age of Onset  . Diabetes Maternal Grandmother   . Arthritis Mother   . Arthritis Paternal Grandmother   . Stroke Father   . Stroke Paternal Uncle   . Diabetes Maternal Grandmother   . Diabetes Paternal Uncle     Medications and Allergies reviewed  Review of Systems:    GEN: No fevers, chills. Nontoxic. Primarily MSK c/o today. MSK: Detailed in the HPI GI: tolerating PO intake without difficulty Neuro: as above Otherwise, the pertinent positives and negatives are listed above and in the HPI, otherwise a full review of systems has been reviewed and is negative unless noted positive.   Objective:   Physical Examination: BP 100/70  Pulse 81  Temp(Src) 98.4 F (36.9 C) (Oral)  Ht 5' 2.5" (1.588 m)  Wt 173 lb 4 oz (78.586 kg)  BMI 31.16 kg/m2  LMP 05/03/2014    GEN: WDWN, NAD, Non-toxic, Alert & Oriented x 3 HEENT: Atraumatic, Normocephalic.  Ears and Nose: No external deformity. EXTR: No clubbing/cyanosis/edema NEURO: Normal gait.  PSYCH: Normally interactive. Conversant. Not depressed or anxious appearing.  Calm demeanor.   Knee:  L Gait: Normal heel toe pattern ROM: 0-130 Effusion: neg Echymosis or edema: none Patellar tendon NT Painful PLICA: neg Patellar grind: negative Medial and lateral patellar facet loading: negative medial and lateral joint  lines:NT Mcmurray's neg Flexion-pinch neg Varus and valgus stress: stable Lachman: neg Ant and [place] drawer: neg Hip abduction, IR, ER: WNL Hip flexion str: 5/5 Hip abd: 5/5 Quad: 5/5 VMO atrophy:No Hamstring concentric and eccentric: 5/5    Radiology: Dg Lumbar Spine Complete  03/07/2014   CLINICAL DATA:  Intermittent low back and LEFT lower leg pain question radiculopathy, lumbago  EXAM: LUMBAR SPINE - COMPLETE 4+ VIEW  COMPARISON:  None  FINDINGS: Osseous mineralization normal.  Five non-rib bearing lumbar vertebrae.  Vertebral body and disc space heights maintained.  No acute fracture, subluxation or bone destruction.  No spondylolysis.  SI joints symmetric.  IMPRESSION: Normal exam.   Electronically Signed   By: Ulyses Southward M.D.   On: 03/07/2014 16:36   EXAM: LEFT ANKLE COMPLETE - 3+ VIEW   COMPARISON:  None.   FINDINGS: There is lateral soft tissue swelling. There is an ossicle off of the tip of the lateral malleolus. The mortise is intact. There is no fracture or dislocation. There is mild tibiotalar arthritis. There is no significant joint effusion.   IMPRESSION: Nonspecific findings including mild lateral soft tissue swelling and mild arthritis. Tiny bone fragment off of the lateral malleolus tip appears most consistent with a developmental ossicle.     Electronically Signed   By: Esperanza Heir M.D.   On: 11/18/2013 20:35 EXAM: LEFT KNEE - 1-2 VIEW   COMPARISON:  None.   FINDINGS: Imaged bones, joints and soft tissues appear normal.   IMPRESSION: Negative exam.     Electronically Signed   By: Drusilla Kanner M.D.   On: 01/24/2014 21:31  Assessment & Plan:   Internal derangement of left knee  Lumbar radiculopathy  She has done great. F/u prn  Stop elavil in 1 mo  New Prescriptions   No medications on file   Follow-up: prn Unless noted above, the patient is to follow-up if symptoms worsen. Red flags were reviewed with the  patient.  Signed,  Elpidio Galea. Mykenna Viele, MD, CAQ Sports Medicine   Discontinued Medications   No medications on file   Current Medications at Discharge:   Medication List       This list is accurate as of: 05/16/14  8:26 AM.  Always use your most recent med list.               amitriptyline 25 MG tablet  Commonly known as:  ELAVIL  Take 1 tablet (25 mg total) by mouth at bedtime.

## 2014-08-17 ENCOUNTER — Ambulatory Visit (INDEPENDENT_AMBULATORY_CARE_PROVIDER_SITE_OTHER): Payer: BC Managed Care – PPO | Admitting: Family Medicine

## 2014-08-17 ENCOUNTER — Encounter: Payer: Self-pay | Admitting: Family Medicine

## 2014-08-17 VITALS — BP 90/64 | HR 73 | Temp 98.2°F | Ht 62.5 in | Wt 174.5 lb

## 2014-08-17 DIAGNOSIS — M25562 Pain in left knee: Secondary | ICD-10-CM

## 2014-08-17 NOTE — Progress Notes (Signed)
[address] [place] [address] Phone: [phone] Fax: [phone]  Patient ID: Hannah Walton MRN: [id number], DOB: 04/10/1981, 33 y.o. Date of Encounter: 08/17/2014  Primary Physician:  Roxy MannsMarne Tower, MD   Chief Complaint: Follow-up   Subjective:   Initially, when I saw the patient I had concern for significant knee derangement.  [facility] we continued the patient's  NSAIDs, and injected her knee with  Corticosteroid.  She did quite well for some time, but she has had significant return of symptoms, and she is actually worse than she was initially.  Significant problems with flexion and rotation.  05/16/2014 Last OV with Hannah BeatSpencer Arianni Gallego, MD  Better. Last time I felt like she probably had left knee joint irritation and synovitis versus internal derangement, and we did an intra-articular injection of the left knee. Also started her on Elavil for some lumbar radiculopathy. At this point, she is now basically completely asymptomatic.  04/03/2014 Last OV with Hannah BeatSpencer Myishia Kasik, MD  Thank you for having me see Hannah Walton in consultation today [facility] for her problem with LEFT LEG PAIN.  As you may recall, she is a 33 y.o. year old female with a history of left leg pain has been ongoing for approximately 2 months. 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 [facility], and Dr. Alwyn RenHopper did some ankle x-rays as well as a left knee film.  I reviewed the films, and her left AP and lateral films that were non-weightbearing are grossly unremarkable. There is no sunrise or merchant view.  She also very recently had an additional set of some lumbar spine films, which were grossly unremarkable also without significant spondyloarthropathy.  She tried a number of medications, but all the meds made her feel fairly well, and she  recently had some nausea with oral Voltaren, so she stopped this.  She describes pain and is mostly having her pain specifically in and around the knee joint itself right now, but she does have some pain that will radiate down the leg and down part of the lower extremity, but it does not include the foot.  She has no known history of trauma or prior operative intervention.   Past Medical History  Diagnosis Date  . Stomach ulcer   . History of arthritis   . History of bloody stools   . History of chicken pox   . History of headache   . History of migraine   . History of urinary incontinence     No past surgical history on file.  History   Social History  . Marital Status: Married    Spouse Name: N/A    Number of Children: N/A  . Years of Education: N/A   Social History Main Topics  . Smoking status: Never Smoker   . Smokeless tobacco: Never Used  . Alcohol Use: No  . Drug Use: No  . Sexual Activity: None   Other Topics Concern  . None   Social History Narrative  . None    Family History  Problem Relation Age of Onset  . Diabetes Maternal Grandmother   . Arthritis Mother   . Arthritis Paternal Grandmother   . Stroke Father   . Stroke Paternal Uncle   . Diabetes Maternal Grandmother   . Diabetes Paternal Uncle  Medications and Allergies reviewed  Review of Systems:    GEN: No fevers, chills. Nontoxic. Primarily MSK c/o today. MSK: Detailed in the HPI GI: tolerating PO intake without difficulty Neuro: as above Otherwise, the pertinent positives and negatives are listed above and in the HPI, otherwise a full review of systems has been reviewed and is negative unless noted positive.   Objective:   Physical Examination: BP 90/64  Pulse 73  Temp(Src) 98.2 F (36.8 C) (Oral)  Ht 5' 2.5" (1.588 m)  Wt 174 lb 8 oz (79.153 kg)  BMI 31.39 kg/m2  LMP 07/23/2014    GEN: WDWN, NAD, Non-toxic, Alert & Oriented x 3 HEENT: Atraumatic, Normocephalic.  Ears  and Nose: No external deformity. EXTR: No clubbing/cyanosis/edema NEURO: Normal gait.  PSYCH: Normally interactive. Conversant. Not depressed or anxious appearing.  Calm demeanor.   Knee:  L Gait: Normal heel toe pattern, mild antalgia ROM: 0-115 Effusion: minimal to mild Echymosis or edema: none Patellar tendon NT Painful PLICA: neg Patellar grind: negative Medial and lateral patellar facet loading: negative medial and lateral joint lines: mild posterior pain Mcmurray's positive for pain only Flexion-pinch positive Varus and valgus stress: stable Lachman: neg Ant and Post drawer: neg Hip abduction, IR, ER: WNL Hip flexion str: 5/5 Hip abd: 5/5 Quad: 5/5 VMO atrophy:No Hamstring concentric and eccentric: 5/5    Radiology:  LEFT KNEE - 1-2 VIEW   COMPARISON:  None.   FINDINGS: Imaged bones, joints and soft tissues appear normal.   IMPRESSION: Negative exam.     Electronically Signed   By: Drusilla Kannerhomas  Dalessio M.D.   On: 01/24/2014 21:31  Assessment & Plan:   Left knee pain - Plan: MR Knee Left  Wo Contrast  In a 33 year old patient, high level for meniscal tear, significant clinical concern.  Failure of conservative management 3 months.  Obtain an MRI of the left knee to evaluate for meniscal tear or other occult internal derangement. Positive flexion pinch and positive McMurray's test for pain  Signed,  Larue Drawdy T. Eshika Reckart, MD, CAQ Sports Medicine   Discontinued Medications   AMITRIPTYLINE (ELAVIL) 25 MG TABLET    Take 1 tablet (25 mg total) by mouth at bedtime.   Current Medications at Discharge:   Medication List    Notice As of 08/17/2014 11:59 PM   You have not been prescribed any medications.

## 2014-08-17 NOTE — Progress Notes (Signed)
Pre visit review using our clinic review tool, if applicable. No additional management support is needed unless otherwise documented below in the visit note. 

## 2014-08-17 NOTE — Patient Instructions (Signed)

## 2014-08-30 ENCOUNTER — Other Ambulatory Visit: Payer: BC Managed Care – PPO

## 2014-08-30 ENCOUNTER — Ambulatory Visit
Admission: RE | Admit: 2014-08-30 | Discharge: 2014-08-30 | Disposition: A | Discharge status: Home / Self Care | Payer: BC Managed Care – PPO | Source: Ambulatory Visit | Attending: Family Medicine | Admitting: Family Medicine

## 2014-08-30 DIAGNOSIS — M25562 Pain in left knee: Secondary | ICD-10-CM

## 2014-09-01 ENCOUNTER — Telehealth: Payer: Self-pay

## 2014-09-01 DIAGNOSIS — M25561 Pain in right knee: Secondary | ICD-10-CM

## 2014-09-01 DIAGNOSIS — M67461 Ganglion, right knee: Secondary | ICD-10-CM

## 2014-09-01 NOTE — Telephone Encounter (Signed)
MRI - no ligament or cartilage tears.   She has a fairly large cyst inside of her knee joint.   I will call her over the weekend, or possibly Monday. I am leaving right now to go out of town.  Nothing emergent at all, but i want to talk to her more.

## 2014-09-01 NOTE — Telephone Encounter (Signed)
Pt left v/m requesting cb at [phone] with MRI results done on 08/30/14.

## 2014-09-01 NOTE — Telephone Encounter (Signed)
Marguriete notified as instructed by telephone.  Will await your phone call to discuss these results further.

## 2014-09-01 NOTE — Telephone Encounter (Signed)
Left message for Hannah Walton to return my call.

## 2014-09-04 NOTE — Telephone Encounter (Signed)
Pt called to discuss further results. Pt is still in pain.  [phone]

## 2014-09-04 NOTE — Telephone Encounter (Signed)
Discussed, consult made

## 2014-09-05 MED ORDER — TRAMADOL HCL 50 MG PO TABS: 50.0000 mg | ORAL_TABLET | Freq: Four times a day (QID) | ORAL | Status: DC | PRN

## 2014-09-05 NOTE — Telephone Encounter (Signed)
i spoke to her at length about her MRI and my recommendations yesterday afternoon. Non-urgent consultation recommended.   Lupita LeashDonna, can you send in some tramadol for the patient. She can take along with tylenol for pain relief.  Tramadol 50 mg, 1 po q 6 hours prn pain, #40, 0 refills   Cc: Mrs. Rexford MausKolovrat

## 2014-09-05 NOTE — Telephone Encounter (Addendum)
Pt left v/m requesting med for pain to [facility] [address] and pt also request cb about referral. Note sent to Dr Patsy Lageropland for pain mgt and Shirlee LimerickMarion for referral appt.

## 2014-09-05 NOTE — Telephone Encounter (Addendum)
Tramadol called to [facility] [address].  Left message that medication has been called to her pharmacy.  Also advised Shirlee LimerickMarion would contact her once she gets her referral scheduled and to try a be patient because it can take a few days.

## 2014-09-05 NOTE — Addendum Note (Signed)
Addended by: Damita LackLORING, Adithi Gammon S on: 09/05/2014 10:27 AM   Modules accepted: Orders

## 2014-10-20 DIAGNOSIS — M659 Synovitis and tenosynovitis, unspecified: Secondary | ICD-10-CM

## 2014-10-20 DIAGNOSIS — M65969 Unspecified synovitis and tenosynovitis, unspecified lower leg: Secondary | ICD-10-CM

## 2014-10-20 DIAGNOSIS — M25869 Other specified joint disorders, unspecified knee: Secondary | ICD-10-CM

## 2014-10-20 HISTORY — DX: Unspecified synovitis and tenosynovitis, unspecified lower leg: M65.969

## 2014-10-20 HISTORY — DX: Other specified joint disorders, unspecified knee: M25.869

## 2014-10-20 HISTORY — DX: Synovitis and tenosynovitis, unspecified: M65.9

## 2014-10-23 ENCOUNTER — Encounter (HOSPITAL_BASED_OUTPATIENT_CLINIC_OR_DEPARTMENT_OTHER): Payer: Self-pay | Admitting: *Deleted

## 2014-10-27 ENCOUNTER — Ambulatory Visit (HOSPITAL_BASED_OUTPATIENT_CLINIC_OR_DEPARTMENT_OTHER): Payer: BLUE CROSS/BLUE SHIELD | Admitting: Anesthesiology

## 2014-10-27 ENCOUNTER — Ambulatory Visit (HOSPITAL_BASED_OUTPATIENT_CLINIC_OR_DEPARTMENT_OTHER)
Admission: RE | Admit: 2014-10-27 | Discharge: 2014-10-27 | Disposition: A | Discharge status: Home / Self Care | Payer: BLUE CROSS/BLUE SHIELD | Source: Ambulatory Visit | Attending: Orthopedic Surgery | Admitting: Orthopedic Surgery

## 2014-10-27 ENCOUNTER — Encounter (HOSPITAL_BASED_OUTPATIENT_CLINIC_OR_DEPARTMENT_OTHER)
Admission: RE | Disposition: A | Discharge status: Home / Self Care | Payer: Self-pay | Source: Ambulatory Visit | Attending: Orthopedic Surgery

## 2014-10-27 ENCOUNTER — Encounter (HOSPITAL_BASED_OUTPATIENT_CLINIC_OR_DEPARTMENT_OTHER): Payer: Self-pay | Admitting: *Deleted

## 2014-10-27 DIAGNOSIS — Z886 Allergy status to analgesic agent status: Secondary | ICD-10-CM | POA: Insufficient documentation

## 2014-10-27 DIAGNOSIS — R51 Headache: Secondary | ICD-10-CM | POA: Insufficient documentation

## 2014-10-27 DIAGNOSIS — Z833 Family history of diabetes mellitus: Secondary | ICD-10-CM | POA: Insufficient documentation

## 2014-10-27 DIAGNOSIS — M25562 Pain in left knee: Secondary | ICD-10-CM | POA: Diagnosis present

## 2014-10-27 DIAGNOSIS — Z6832 Body mass index (BMI) 32.0-32.9, adult: Secondary | ICD-10-CM | POA: Insufficient documentation

## 2014-10-27 DIAGNOSIS — Z823 Family history of stroke: Secondary | ICD-10-CM | POA: Diagnosis not present

## 2014-10-27 DIAGNOSIS — M7122 Synovial cyst of popliteal space [Baker], left knee: Secondary | ICD-10-CM | POA: Diagnosis not present

## 2014-10-27 DIAGNOSIS — Z8261 Family history of arthritis: Secondary | ICD-10-CM | POA: Diagnosis not present

## 2014-10-27 DIAGNOSIS — M25862 Other specified joint disorders, left knee: Secondary | ICD-10-CM | POA: Diagnosis present

## 2014-10-27 HISTORY — DX: Headache, unspecified: R51.9

## 2014-10-27 HISTORY — DX: Headache: R51

## 2014-10-27 HISTORY — PX: MASS EXCISION: SHX2000

## 2014-10-27 HISTORY — DX: Other specified joint disorders, left knee: M25.862

## 2014-10-27 HISTORY — DX: Other specified joint disorders, unspecified knee: M25.869

## 2014-10-27 HISTORY — PX: KNEE ARTHROSCOPY: SHX127

## 2014-10-27 HISTORY — DX: Synovitis and tenosynovitis, unspecified: M65.9

## 2014-10-27 HISTORY — DX: Personal history of other diseases of the digestive system: Z87.19

## 2014-10-27 HISTORY — DX: Personal history of peptic ulcer disease: Z87.11

## 2014-10-27 LAB — POCT HEMOGLOBIN-HEMACUE: HEMOGLOBIN: 12.8 g/dL (ref 12.0–15.0)

## 2014-10-27 SURGERY — ARTHROSCOPY, KNEE
Anesthesia: General | Site: Knee | Laterality: Left

## 2014-10-27 MED ORDER — MIDAZOLAM HCL 2 MG/2ML IJ SOLN: 1.0000 mg | INTRAMUSCULAR | Status: DC | PRN

## 2014-10-27 MED ORDER — PROPOFOL 10 MG/ML IV BOLUS
INTRAVENOUS | Status: DC | PRN
  Administered 2014-10-27: 150 mg via INTRAVENOUS
  Administered 2014-10-27: 50 mg via INTRAVENOUS

## 2014-10-27 MED ORDER — MIDAZOLAM HCL 2 MG/2ML IJ SOLN
INTRAMUSCULAR | Status: AC
  Filled 2014-10-27: qty 2

## 2014-10-27 MED ORDER — BACLOFEN 10 MG PO TABS: 10.0000 mg | ORAL_TABLET | Freq: Three times a day (TID) | ORAL | Status: DC

## 2014-10-27 MED ORDER — DEXAMETHASONE SODIUM PHOSPHATE 4 MG/ML IJ SOLN
INTRAMUSCULAR | Status: DC | PRN
  Administered 2014-10-27: 10 mg via INTRAVENOUS

## 2014-10-27 MED ORDER — FENTANYL CITRATE 0.05 MG/ML IJ SOLN: 50.0000 ug | INTRAMUSCULAR | Status: DC | PRN

## 2014-10-27 MED ORDER — HYDROMORPHONE HCL 1 MG/ML IJ SOLN
0.2500 mg | INTRAMUSCULAR | Status: DC | PRN
  Administered 2014-10-27 (×2): 0.5 mg via INTRAVENOUS

## 2014-10-27 MED ORDER — PROMETHAZINE HCL 25 MG/ML IJ SOLN
6.2500 mg | INTRAMUSCULAR | Status: DC | PRN
Start: 2014-10-27 — End: 2014-10-27
  Administered 2014-10-27: 6.25 mg via INTRAVENOUS

## 2014-10-27 MED ORDER — MIDAZOLAM HCL 5 MG/5ML IJ SOLN
INTRAMUSCULAR | Status: DC | PRN
Start: 2014-10-27 — End: 2014-10-27
  Administered 2014-10-27: 2 mg via INTRAVENOUS

## 2014-10-27 MED ORDER — ONDANSETRON HCL 4 MG/2ML IJ SOLN
INTRAMUSCULAR | Status: DC | PRN
  Administered 2014-10-27: 4 mg via INTRAVENOUS

## 2014-10-27 MED ORDER — SODIUM CHLORIDE 0.9 % IR SOLN
Status: DC | PRN
  Administered 2014-10-27: 9000 mL

## 2014-10-27 MED ORDER — BUPIVACAINE HCL (PF) 0.25 % IJ SOLN
INTRAMUSCULAR | Status: DC | PRN
  Administered 2014-10-27: 20 mL

## 2014-10-27 MED ORDER — SODIUM CHLORIDE 0.9 % IJ SOLN
INTRAMUSCULAR | Status: AC
  Filled 2014-10-27: qty 10

## 2014-10-27 MED ORDER — LACTATED RINGERS IV SOLN
INTRAVENOUS | Status: DC
  Administered 2014-10-27 (×2): via INTRAVENOUS

## 2014-10-27 MED ORDER — OXYCODONE HCL 5 MG PO TABS: 5.0000 mg | ORAL_TABLET | Freq: Once | ORAL | Status: DC | PRN

## 2014-10-27 MED ORDER — SENNA-DOCUSATE SODIUM 8.6-50 MG PO TABS: 2.0000 | ORAL_TABLET | Freq: Every day | ORAL | Status: DC

## 2014-10-27 MED ORDER — MIDAZOLAM HCL 2 MG/ML PO SYRP: 12.0000 mg | ORAL_SOLUTION | Freq: Once | ORAL | Status: DC | PRN

## 2014-10-27 MED ORDER — CEFAZOLIN SODIUM-DEXTROSE 2-3 GM-% IV SOLR
INTRAVENOUS | Status: AC
  Filled 2014-10-27: qty 50

## 2014-10-27 MED ORDER — OXYCODONE HCL 5 MG/5ML PO SOLN: 5.0000 mg | Freq: Once | ORAL | Status: DC | PRN

## 2014-10-27 MED ORDER — OXYCODONE-ACETAMINOPHEN 10-325 MG PO TABS: 1.0000 | ORAL_TABLET | Freq: Four times a day (QID) | ORAL | Status: DC | PRN

## 2014-10-27 MED ORDER — ONDANSETRON HCL 4 MG PO TABS: 4.0000 mg | ORAL_TABLET | Freq: Three times a day (TID) | ORAL | Status: DC | PRN

## 2014-10-27 MED ORDER — LIDOCAINE HCL (CARDIAC) 20 MG/ML IV SOLN
INTRAVENOUS | Status: DC | PRN
  Administered 2014-10-27: 80 mg via INTRAVENOUS

## 2014-10-27 MED ORDER — FENTANYL CITRATE 0.05 MG/ML IJ SOLN
INTRAMUSCULAR | Status: DC | PRN
  Administered 2014-10-27: 50 ug via INTRAVENOUS
  Administered 2014-10-27 (×2): 25 ug via INTRAVENOUS
  Administered 2014-10-27: 50 ug via INTRAVENOUS
  Administered 2014-10-27 (×2): 25 ug via INTRAVENOUS

## 2014-10-27 MED ORDER — CEFAZOLIN SODIUM-DEXTROSE 2-3 GM-% IV SOLR
2.0000 g | INTRAVENOUS | Status: AC
  Administered 2014-10-27: 2 g via INTRAVENOUS

## 2014-10-27 MED ORDER — HYDROMORPHONE HCL 1 MG/ML IJ SOLN
INTRAMUSCULAR | Status: AC
  Filled 2014-10-27: qty 1

## 2014-10-27 MED ORDER — FENTANYL CITRATE 0.05 MG/ML IJ SOLN
INTRAMUSCULAR | Status: AC
  Filled 2014-10-27: qty 6

## 2014-10-27 MED ORDER — PROMETHAZINE HCL 25 MG/ML IJ SOLN
INTRAMUSCULAR | Status: AC
  Filled 2014-10-27: qty 1

## 2014-10-27 SURGICAL SUPPLY — 66 items
BANDAGE ELASTIC 3 VELCRO ST LF (GAUZE/BANDAGES/DRESSINGS) ×1 IMPLANT
BANDAGE ELASTIC 6 VELCRO ST LF (GAUZE/BANDAGES/DRESSINGS) ×2 IMPLANT
BANDAGE ESMARK 6X9 LF (GAUZE/BANDAGES/DRESSINGS) IMPLANT
BLADE CUTTER GATOR 3.5 (BLADE) ×2 IMPLANT
BLADE SURG 15 STRL LF DISP TIS (BLADE) ×1 IMPLANT
BLADE SURG 15 STRL SS (BLADE)
BNDG CMPR 9X4 STRL LF SNTH (GAUZE/BANDAGES/DRESSINGS)
BNDG CMPR 9X6 STRL LF SNTH (GAUZE/BANDAGES/DRESSINGS)
BNDG ESMARK 4X9 LF (GAUZE/BANDAGES/DRESSINGS) IMPLANT
BNDG ESMARK 6X9 LF (GAUZE/BANDAGES/DRESSINGS)
CANISTER SUCT 3000ML (MISCELLANEOUS) IMPLANT
CLSR STERI-STRIP ANTIMIC 1/2X4 (GAUZE/BANDAGES/DRESSINGS) ×2 IMPLANT
CORDS BIPOLAR (ELECTRODE) ×1 IMPLANT
COVER BACK TABLE 60X90IN (DRAPES) ×1 IMPLANT
CUFF TOURNIQUET SINGLE 18IN (TOURNIQUET CUFF) IMPLANT
CUFF TOURNIQUET SINGLE 34IN LL (TOURNIQUET CUFF) ×1 IMPLANT
CUTTER KNOT PUSHER 2-0 FIBERWI (INSTRUMENTS) IMPLANT
CUTTER MENISCUS  4.2MM (BLADE)
CUTTER MENISCUS 4.2MM (BLADE) IMPLANT
DRAPE ARTHROSCOPY W/POUCH 90 (DRAPES) ×2 IMPLANT
DRAPE EXTREMITY T 121X128X90 (DRAPE) ×1 IMPLANT
DRAPE SURG 17X23 STRL (DRAPES) ×1 IMPLANT
DRAPE U 20/CS (DRAPES) ×2 IMPLANT
DURAPREP 26ML APPLICATOR (WOUND CARE) ×2 IMPLANT
ELECT MENISCUS 165MM 90D (ELECTRODE) IMPLANT
ELECT REM PT RETURN 9FT ADLT (ELECTROSURGICAL)
ELECTRODE REM PT RTRN 9FT ADLT (ELECTROSURGICAL) IMPLANT
GAUZE SPONGE 4X4 12PLY STRL (GAUZE/BANDAGES/DRESSINGS) ×2 IMPLANT
GLOVE BIO SURGEON STRL SZ 6.5 (GLOVE) ×1 IMPLANT
GLOVE BIO SURGEON STRL SZ7.5 (GLOVE) ×2 IMPLANT
GLOVE BIO SURGEON STRL SZ8 (GLOVE) ×2 IMPLANT
GLOVE BIOGEL PI IND STRL 7.0 (GLOVE) IMPLANT
GLOVE BIOGEL PI IND STRL 8 (GLOVE) ×2 IMPLANT
GLOVE BIOGEL PI INDICATOR 7.0 (GLOVE) ×2
GLOVE BIOGEL PI INDICATOR 8 (GLOVE) ×2
GLOVE ORTHO TXT STRL SZ7.5 (GLOVE) ×2 IMPLANT
GLOVE SURG ORTHO 8.0 STRL STRW (GLOVE) ×2 IMPLANT
GOWN STRL REUS W/ TWL LRG LVL3 (GOWN DISPOSABLE) ×1 IMPLANT
GOWN STRL REUS W/ TWL XL LVL3 (GOWN DISPOSABLE) ×2 IMPLANT
GOWN STRL REUS W/TWL LRG LVL3 (GOWN DISPOSABLE) ×2
GOWN STRL REUS W/TWL XL LVL3 (GOWN DISPOSABLE) ×4
KNEE WRAP E Z 3 GEL PACK (MISCELLANEOUS) ×1 IMPLANT
MANIFOLD NEPTUNE II (INSTRUMENTS) ×2 IMPLANT
NDL HYPO 25X1 1.5 SAFETY (NEEDLE) ×1 IMPLANT
NEEDLE HYPO 25X1 1.5 SAFETY (NEEDLE) IMPLANT
NS IRRIG 1000ML POUR BTL (IV SOLUTION) ×2 IMPLANT
PACK ARTHROSCOPY DSU (CUSTOM PROCEDURE TRAY) ×2 IMPLANT
PACK BASIN DAY SURGERY FS (CUSTOM PROCEDURE TRAY) ×2 IMPLANT
PAD CAST 3X4 CTTN HI CHSV (CAST SUPPLIES) ×1 IMPLANT
PADDING CAST ABS 3INX4YD NS (CAST SUPPLIES)
PADDING CAST ABS 4INX4YD NS (CAST SUPPLIES) ×1
PADDING CAST ABS COTTON 3X4 (CAST SUPPLIES) ×1 IMPLANT
PADDING CAST ABS COTTON 4X4 ST (CAST SUPPLIES) ×1 IMPLANT
PADDING CAST COTTON 3X4 STRL (CAST SUPPLIES) ×2
PENCIL BUTTON HOLSTER BLD 10FT (ELECTRODE) IMPLANT
SET ARTHROSCOPY TUBING (MISCELLANEOUS) ×2
SET ARTHROSCOPY TUBING LN (MISCELLANEOUS) ×1 IMPLANT
SLEEVE SCD COMPRESS KNEE MED (MISCELLANEOUS) IMPLANT
SUT ETHILON 4 0 PS 2 18 (SUTURE) ×2 IMPLANT
SUT MNCRL AB 4-0 PS2 18 (SUTURE) ×2 IMPLANT
SYR BULB 3OZ (MISCELLANEOUS) ×1 IMPLANT
SYR CONTROL 10ML LL (SYRINGE) ×1 IMPLANT
TOWEL OR 17X24 6PK STRL BLUE (TOWEL DISPOSABLE) ×2 IMPLANT
UNDERPAD 30X30 INCONTINENT (UNDERPADS AND DIAPERS) ×1 IMPLANT
WAND STAR VAC 90 (SURGICAL WAND) IMPLANT
WATER STERILE IRR 1000ML POUR (IV SOLUTION) ×2 IMPLANT

## 2014-10-27 NOTE — Anesthesia Postprocedure Evaluation (Signed)
  Anesthesia Post-op Note  Patient: Hannah Walton  Procedure(s) Performed: Procedure(s): LEFT KNEE SCOPE SYNOVECTOMY (Left) LEFT PCL CYST EXCISION  (Left)  Patient Location: PACU  Anesthesia Type:General  Level of Consciousness: awake and alert   Airway and Oxygen Therapy: Patient Spontanous Breathing  Post-op Pain: moderate  Post-op Assessment: Post-op Vital signs reviewed, Patient's Cardiovascular Status Stable and Respiratory Function Stable  Post-op Vital Signs: Reviewed  Filed Vitals:   10/27/14 1145  BP: 105/69  Pulse: 78  Temp:   Resp: 12    Complications: No apparent anesthesia complications

## 2014-10-27 NOTE — Anesthesia Preprocedure Evaluation (Addendum)
Anesthesia Evaluation  Patient identified by MRN, date of birth, ID band Patient awake    Reviewed: Allergy & Precautions, NPO status , Patient's Chart, lab work & pertinent test results  Airway Mallampati: II  TM Distance: >3 FB Neck ROM: Full    Dental  (+) Teeth Intact   Pulmonary neg pulmonary ROS,          Cardiovascular negative cardio ROS      Neuro/Psych  Headaches, negative psych ROS   GI/Hepatic negative GI ROS, Neg liver ROS,   Endo/Other  Morbid obesity  Renal/GU negative Renal ROS     Musculoskeletal  (+) Arthritis -,   Abdominal   Peds  Hematology negative hematology ROS (+)   Anesthesia Other Findings   Reproductive/Obstetrics                            Anesthesia Physical Anesthesia Plan  ASA: I  Anesthesia Plan: General   Post-op Pain Management:    Induction: Intravenous  Airway Management Planned: LMA  Additional Equipment:   Intra-op Plan:   Post-operative Plan:   Informed Consent: I have reviewed the patients History and Physical, chart, labs and discussed the procedure including the risks, benefits and alternatives for the proposed anesthesia with the patient or authorized representative who has indicated his/her understanding and acceptance.   Dental advisory given  Plan Discussed with: CRNA  Anesthesia Plan Comments:         Anesthesia Quick Evaluation

## 2014-10-27 NOTE — Anesthesia Procedure Notes (Signed)
Procedure Name: LMA Insertion Date/Time: 10/27/2014 9:19 AM Performed by: Gar GibbonKEETON, Nicklos Gaxiola S Pre-anesthesia Checklist: Patient identified, Emergency Drugs available, Suction available and Patient being monitored Patient Re-evaluated:Patient Re-evaluated prior to inductionOxygen Delivery Method: Circle System Utilized Preoxygenation: Pre-oxygenation with 100% oxygen Intubation Type: IV induction Ventilation: Mask ventilation without difficulty LMA: LMA inserted LMA Size: 4.0 Number of attempts: 1 Airway Equipment and Method: bite block Placement Confirmation: positive ETCO2 Tube secured with: Tape Dental Injury: Teeth and Oropharynx as per pre-operative assessment

## 2014-10-27 NOTE — Discharge Instructions (Signed)
Diet: As you were doing prior to hospitalization  ° °Shower:  May shower but keep the wounds dry, use an occlusive plastic wrap, NO SOAKING IN TUB.  If the bandage gets wet, change with a clean dry gauze. ° °Dressing:  You may change your dressing 3-5 days after surgery.  Then change the dressing daily with sterile gauze dressing.   ° °There are sticky tapes (steri-strips) on your wounds and all the stitches are absorbable.  Leave the steri-strips in place when changing your dressings, they will peel off with time, usually 2-3 weeks. ° °Activity:  Increase activity slowly as tolerated, but follow the weight bearing instructions below.  No lifting or driving for 6 weeks. ° °Weight Bearing:   As tolerated.   ° °To prevent constipation: you may use a stool softener such as - ° °Colace (over the counter) 100 mg by mouth twice a day  °Drink plenty of fluids (prune juice may be helpful) and high fiber foods °Miralax (over the counter) for constipation as needed.   ° °Itching:  If you experience itching with your medications, try taking only a single pain pill, or even half a pain pill at a time.  You may take up to 10 pain pills per day, and you can also use benadryl over the counter for itching or also to help with sleep.  ° °Precautions:  If you experience chest pain or shortness of breath - call 911 immediately for transfer to the hospital emergency department!! ° °If you develop a fever greater that 101 F, purulent drainage from wound, increased redness or drainage from wound, or calf pain -- Call the office at [phone]                                                °Follow- Up Appointment:  Please call for an appointment to be seen in 2 weeks [place] - [phone] ° °Discharge Instructions After Orthopedic Procedures: ° °*You may feel tired and weak following your procedure. It is recommended that you limit physical activity for the next 24 hours and rest at home for the remainder of today and tomorrow. °*No  strenuous activity should be started without your doctor's permission. ° °Elevate the extremity that you had surgery on to a level above your heart. This should continue for 48 hours or as instructed by your doctor. ° °If you had hand, arm or shoulder surgery you should move your fingers frequently unless otherwise instructed by your doctor. ° °If you had foot, knee or leg surgery you should wiggle your toes frequently unless otherwise instructed by your doctor. ° °Follow your doctor's exact instructions for activity at home. Use your home equipment as instructed. (Crutches, hard shoes, slings etc.) ° °Limit your activity as instructed by your doctor. ° °Report to your doctor should any of the following occur: °1. Extreme swelling of your fingers or toes. °2. Inability to wiggle your fingers or toes. °3. Coldness, pale or bluish color in your fingers or toes. °4. Loss of sensation, numbness or tingling of your fingers or toes. °5. Unusual smell or odor from under your dressing or cast. °6. Excessive bleeding or drainage from the surgical site. °7. Pain not relieved by medication your doctor has prescribed for you. °8. Cast or dressing too tight (do not get your dressing or cast wet or put anything   under          your dressing or cast.)  *Do not change your dressing unless instructed by your doctor or discharge nurse. Then follow exact instructions.  *Follow labeled instructions for any medications that your doctor may have prescribed for you. *Should any questions or complications develop following your procedure, PLEASE CONTACT YOUR DOCTOR.   Post Anesthesia Home Care Instructions  Activity: Get plenty of rest for the remainder of the day. A responsible adult should stay with you for 24 hours following the procedure.  For the next 24 hours, DO NOT: -Drive a car -Advertising copywriterperate machinery -Drink alcoholic beverages -Take any medication unless instructed by your physician -Make any legal decisions or sign  important papers.  Meals: Start with liquid foods such as gelatin or soup. Progress to regular foods as tolerated. Avoid greasy, spicy, heavy foods. If nausea and/or vomiting occur, drink only clear liquids until the nausea and/or vomiting subsides. Call your physician if vomiting continues.  Special Instructions/Symptoms: Your throat may feel dry or sore from the anesthesia or the breathing tube placed in your throat during surgery. If this causes discomfort, gargle with warm salt water. The discomfort should disappear within 24 hours.

## 2014-10-27 NOTE — Op Note (Signed)
10/27/2014  10:45 AM  PATIENT:  Hannah Walton    PRE-OPERATIVE DIAGNOSIS: Left knee posterior PCL cyst  POST-OPERATIVE DIAGNOSIS:  Same  PROCEDURE:  LEFT KNEE SCOPE SYNOVECTOMY, LEFT PCL CYST EXCISION   SURGEON:  Eulas PostLANDAU,Nason Conradt P, MD  PHYSICIAN ASSISTANT: Janace LittenBrandon Parry, OPA-C, present and scrubbed throughout the case, critical for completion in a timely fashion, and for retraction, instrumentation, and closure.  ANESTHESIA:   General  PREOPERATIVE INDICATIONS:  [name] is a  34 y.o. female who had knee pain with deep flexion and a cyst that was present on MRI who failed conservative measures and elected for surgical management.    The risks benefits and alternatives were discussed with the patient preoperatively including but not limited to the risks of infection, bleeding, nerve injury, cardiopulmonary complications, the need for revision surgery, among others, and the patient was willing to proceed.  OPERATIVE IMPLANTS: None  OPERATIVE FINDINGS: All of the articular cartilage surfaces were normal as well as both menisci. The anterior cruciate ligament and PCL were intact although there was a bleb of a cyst that was present on the anterior cruciate ligament tissue, as well as posterior on the PCL tissue.  OPERATIVE PROCEDURE: The patient was brought to the operating room and placed in the supine position. General anesthesia was blistered. IV antibiotics were given. The left lower extremity was prepped and draped in usual sterile fashion. Time out performed. Diagnostic arthroscopy was carried out the above-named findings. The diagnostic arthroscopy was normal with the exception of the fullness seen within the notch. I initially debrided between the anterior cruciate ligament and the PCL, where there was a fullness within the synovium of the anterior cruciate ligament lining.  I then debrided in the posterior lateral inferioraspect of the anterior cruciate ligament  on the undersurface, although I did not get truly satisfactory release of cystic type fluid.  I then switched portals and drove into the posterior lateral aspect of the joint, and switched to a 70 scope. I made a posterior medial portal with blunt dissection after localization with a spinal needle and then inserted the shaver and debrided posterior to the PCL. I released a little bit of capsule, as well as the lining behind the PCL and marched my way over until I felt the posterior aspect of the lateral femoral condyle.  I then brought the scope back to the anterior part of the knee, and reinspected the inferior lateral aspect of the anterior cruciate ligament, on the femoral side, and debrided a little bit more soft tissue, tested the stability of the anterior cruciate ligament and PCL which were all still intact. I did use a tourniquet for a small portion of the case. I had excellent hemostasis and a palpable pulse in her foot at the time of closure.  After complete cystic decompression was achieved I removed the arthroscopic instruments and repaired the portals with Monocryl followed by Steri-Strips and sterile gauze. This is very unusual case, but I do think that we got adequate decompression of the posterior cyst although it was somewhat challenging.

## 2014-10-27 NOTE — H&P (Signed)
PREOPERATIVE H&Walton  Chief Complaint: LEFT KNEE pain HPI: Hannah Walton is a 34 y.o. female who presents for preoperative history and physical with a diagnosis of LEFT KNEE pain. Symptoms are rated as moderate to severe, and have been worsening.  This is significantly impairing activities of daily living.  She has elected for surgical management. This has been going on since June 2015. Pain is rated 8/10. Worse in flexion. 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 There is no trauma.  Past Medical History  Diagnosis Date  . Sinus headache   . History of gastric ulcer   . 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 Cyst of knee joint 10/2014    left   Past Surgical History  Procedure Laterality Date  . Upper gi endoscopy     History   Social History  . Marital Status: Married    Spouse Name: N/A    Number of Children: N/A  . Years of Education: N/A   Social History Main Topics  . Smoking status: Never Smoker   . Smokeless tobacco: Never Used  . Alcohol Use: No  . Drug Use: No  . Sexual Activity: None   Other Topics Concern  . None   Social History Narrative   Family History  Problem Relation Age of Onset  . Diabetes Maternal Grandmother   . Arthritis Mother   . Arthritis Paternal Grandmother   . Stroke Father   . Stroke Paternal Uncle   . Diabetes Maternal Grandmother   . Diabetes Paternal Uncle    Allergies  Allergen Reactions  . Asa [Aspirin] Other (See Comments)    DUE TO HX. OF GASTRIC ULCER  . Ibuprofen Other (See Comments)    DUE TO HX. OF GASTRIC ULCER   Prior to Admission medications   Not on File     Positive ROS: All other systems have been reviewed and were otherwise negative with the exception of those mentioned in the HPI and as above.  Physical Exam: General: Alert, no acute distress Cardiovascular: No pedal edema Respiratory: No cyanosis, no use of accessory musculature GI: No organomegaly, abdomen is soft and  non-tender Skin: No lesions in the area of chief complaint Neurologic: Sensation intact distally Psychiatric: Patient is competent for consent with normal mood and affect Lymphatic: No axillary or cervical lymphadenopathy  MUSCULOSKELETAL: Left knee has range of motion 0-130 with normal stability. No joint line tenderness.  MRI demonstrates evidence for a posterior cruciate ligament cyst posteriorly.  Assessment: Left knee pain with what appears to be a intra-articular cyst   Plan: Plan for Procedure(s): LEFT KNEE SCOPE SYNOVECTOMY LEFT PCL CYST EXCISION   The risks benefits and alternatives were discussed with the patient including but not limited to the risks of nonoperative treatment, versus surgical intervention including infection, bleeding, nerve injury,  blood clots, cardiopulmonary complications, morbidity, mortality, among others, and they were willing to proceed. We have also discussed the risks for incomplete relief of pain.  Hannah Walton,Hannah Sadowsky P, MD Cell [phone]   10/27/2014 7:37 AM

## 2014-10-27 NOTE — Transfer of Care (Signed)
Immediate Anesthesia Transfer of Care Note  Patient: Hannah Walton  Procedure(s) Performed: Procedure(s): LEFT KNEE SCOPE SYNOVECTOMY (Left) LEFT PCL CYST EXCISION  (Left)  Patient Location: PACU  Anesthesia Type:General  Level of Consciousness: awake, sedated and patient cooperative  Airway & Oxygen Therapy: Patient Spontanous Breathing and Patient connected to face mask oxygen  Post-op Assessment: Report given to PACU RN and Post -op Vital signs reviewed and stable  Post vital signs: Reviewed and stable  Complications: No apparent anesthesia complications

## 2014-10-30 ENCOUNTER — Encounter (HOSPITAL_BASED_OUTPATIENT_CLINIC_OR_DEPARTMENT_OTHER): Payer: Self-pay | Admitting: Orthopedic Surgery

## 2015-03-09 ENCOUNTER — Telehealth: Payer: Self-pay | Admitting: Family Medicine

## 2015-03-09 NOTE — Telephone Encounter (Signed)
I will watch for hospital notes-thanks  Agree with advisement

## 2015-03-09 NOTE — Telephone Encounter (Signed)
[facility] - Client TELEPHONE ADVICE RECORD [facility]  Patient Name: Kyung BaccaAMIKA MCCOLLUM  DOB: 06/02/1981    Initial Comment Caller states his wife is having chest pains, difficulity swallowing and back pain.   Nurse Assessment  Nurse: Laural BenesJohnson, RN, Dondra SpryGail Date/Time Lamount Cohen(Eastern Time): 03/09/2015 9:20:33 AM  Confirm and document reason for call. If symptomatic, describe symptoms. ---Makylee is complaining of chest pains, back pain and difficulty swallowing. onset 3 am hurts when she laughs  Has the patient traveled out of the country within the last 30 days? ---No  Does the patient require triage? ---Yes  Related visit to physician within the last 2 weeks? ---No  Does the PT have any chronic conditions? (i.e. diabetes, asthma, etc.) ---Unknown  Did the patient indicate they were pregnant? ---No     Guidelines    Guideline Title Affirmed Question Affirmed Notes  Chest Pain SEVERE chest pain    Final Disposition User   Go to ED Now Laural BenesJohnson, RN, Dondra SpryGail    Comments

## 2015-03-09 NOTE — Telephone Encounter (Signed)
PLEASE NOTE: All timestamps contained within this report are represented as [place] Time. CONFIDENTIALTY NOTICE: This fax transmission is intended only for the addressee. It contains information that is legally privileged, confidential or otherwise protected from use or disclosure. If you are not the intended recipient, you are strictly prohibited from reviewing, disclosing, copying using or disseminating any of this information or taking any action in reliance on or regarding this information. If you have received this fax in error, please notify us immediately by telephone so that we can arrange for its return to us. Phone: [phone], Toll-Free: [phone], Fax: [phone] Page: 1 of 2 Call Id: [id number] [facility] [place] [facility] - Client TELEPHONE ADVICE RECORD [facility] Patient Name: Hannah Walton Gender: Female DOB: 08/23/1981 Age: [age] Y 10 M 17 D Return Phone Number: [phone] (Primary) Address: City/State/Zip: [place] Client [facility] - Client Client Site [facility] - Day Physician [place], [place] Contact Type Call Call Type Triage / Clinical Caller Name Lisbeth PlyJulius Relationship To Patient Spouse Appointment Disposition EMR Appointment Not Necessary Info pasted into Epic Yes Return Phone Number [phone] (Primary) Chief Complaint CHEST PAIN (>=21 years) - pain, pressure, heaviness or tightness Initial Comment Caller states his wife is having chest pains, difficulity swallowing and back pain. PreDisposition Home Care Nurse Assessment Nurse: Laural BenesJohnson, RN, Dondra SpryGail Date/Time Lamount Cohen(Eastern Time): 03/09/2015 9:20:33 AM Confirm and document reason for call. If symptomatic, describe symptoms. ---Jeris is complaining of chest pains, back pain and difficulty swallowing. onset 3 am hurts when she laughs Has the patient traveled out of the country within the last 30 days? ---No Does the patient  require triage? ---Yes Related visit to physician within the last 2 weeks? ---No Does the PT have any chronic conditions? (i.e. diabetes, asthma, etc.) ---Unknown Did the patient indicate they were pregnant? ---No Guidelines Guideline Title Affirmed Question Affirmed Notes Nurse Date/Time Lamount Cohen(Eastern Time) Chest Pain SEVERE chest pain Liberty HandyJohnson, RN, Gail 03/09/2015 9:22:28 AM Disp. Time Lamount Cohen(Eastern Time) Disposition Final User 03/09/2015 9:14:20 AM Send to Urgent Queue Donnelly AngelicaHowe, Kim 03/09/2015 9:24:31 AM Go to ED Now Yes Laural BenesJohnson, RN, Suzi RootsGail Caller Understands: Yes Disagree/Comply: Comply PLEASE NOTE: All timestamps contained within this report are represented as [place] Standard Time. CONFIDENTIALTY NOTICE: This fax transmission is intended only for the addressee. It contains information that is legally privileged, confidential or otherwise protected from use or disclosure. If you are not the intended recipient, you are strictly prohibited from reviewing, disclosing, copying using or disseminating any of this information or taking any action in reliance on or regarding this information. If you have received this fax in error, please notify us immediately by telephone so that we can arrange for its return to us. Phone: [phone], Toll-Free: [phone], Fax: [phone] Page: 2 of 2 Call Id: [id number] Care Advice Given Per Guideline GO TO ED NOW: You need to be seen in the Emergency Department. Go to the ER at ___________ Hospital. Leave now. Drive carefully. * Another adult should drive. * If immediate transportation is not available via car or taxi, then the patient should be instructed to call [id number]. * Please bring a list of your current medicines when you go to the Emergency Department (ER). * Severe difficulty breathing occurs * Passes out or becomes too weak to stand * You become worse. CARE ADVICE given per Chest Pain (Adult) guideline. After Care Instructions Given Call Event Type User Date  / Time Description Comments User: Fabienne BrunsGail, Johnson,  RN Date/Time Lamount Cohen(Eastern Time): 03/09/2015 9:26:51 AM Rates pain as a 10 with difficulty breathing and swallowing. not gasping for air at this time Husband present and will drive her to ED. Referrals [facility] - ED

## 2015-03-10 ENCOUNTER — Emergency Department: Payer: BLUE CROSS/BLUE SHIELD

## 2015-03-10 ENCOUNTER — Emergency Department
Admission: EM | Admit: 2015-03-10 | Discharge: 2015-03-10 | Disposition: A | Discharge status: Home / Self Care | Payer: BLUE CROSS/BLUE SHIELD | Attending: Emergency Medicine | Admitting: Emergency Medicine

## 2015-03-10 ENCOUNTER — Encounter: Payer: Self-pay | Admitting: *Deleted

## 2015-03-10 DIAGNOSIS — Z79899 Other long term (current) drug therapy: Secondary | ICD-10-CM | POA: Diagnosis not present

## 2015-03-10 DIAGNOSIS — S29012A Strain of muscle and tendon of back wall of thorax, initial encounter: Secondary | ICD-10-CM | POA: Insufficient documentation

## 2015-03-10 DIAGNOSIS — S161XXA Strain of muscle, fascia and tendon at neck level, initial encounter: Secondary | ICD-10-CM | POA: Diagnosis not present

## 2015-03-10 DIAGNOSIS — Y9389 Activity, other specified: Secondary | ICD-10-CM | POA: Insufficient documentation

## 2015-03-10 DIAGNOSIS — X58XXXA Exposure to other specified factors, initial encounter: Secondary | ICD-10-CM | POA: Insufficient documentation

## 2015-03-10 DIAGNOSIS — Y9289 Other specified places as the place of occurrence of the external cause: Secondary | ICD-10-CM | POA: Diagnosis not present

## 2015-03-10 DIAGNOSIS — Z3202 Encounter for pregnancy test, result negative: Secondary | ICD-10-CM | POA: Insufficient documentation

## 2015-03-10 DIAGNOSIS — Y998 Other external cause status: Secondary | ICD-10-CM | POA: Diagnosis not present

## 2015-03-10 DIAGNOSIS — S29019A Strain of muscle and tendon of unspecified wall of thorax, initial encounter: Secondary | ICD-10-CM

## 2015-03-10 DIAGNOSIS — R0789 Other chest pain: Secondary | ICD-10-CM | POA: Diagnosis present

## 2015-03-10 LAB — BASIC METABOLIC PANEL
ANION GAP: 6 (ref 5–15)
BUN: 8 mg/dL (ref 6–20)
CHLORIDE: 105 mmol/L (ref 101–111)
CO2: 26 mmol/L (ref 22–32)
Calcium: 9 mg/dL (ref 8.9–10.3)
Creatinine, Ser: 0.66 mg/dL (ref 0.44–1.00)
GLUCOSE: 122 mg/dL — AB (ref 65–99)
POTASSIUM: 4.5 mmol/L (ref 3.5–5.1)
Sodium: 137 mmol/L (ref 135–145)

## 2015-03-10 LAB — CBC
HCT: 39.1 % (ref 35.0–47.0)
Hemoglobin: 12.7 g/dL (ref 12.0–16.0)
MCH: 27.2 pg (ref 26.0–34.0)
MCHC: 32.5 g/dL (ref 32.0–36.0)
MCV: 83.6 fL (ref 80.0–100.0)
PLATELETS: 252 10*3/uL (ref 150–440)
RBC: 4.67 MIL/uL (ref 3.80–5.20)
RDW: 13.5 % (ref 11.5–14.5)
WBC: 8.7 10*3/uL (ref 3.6–11.0)

## 2015-03-10 LAB — TROPONIN I: Troponin I: <0.03 ng/mL (ref <0.031)

## 2015-03-10 LAB — PREGNANCY, URINE: Preg Test, Ur: NEGATIVE

## 2015-03-10 MED ORDER — DIAZEPAM 5 MG PO TABS
ORAL_TABLET | ORAL | Status: AC
  Filled 2015-03-10: qty 2

## 2015-03-10 MED ORDER — OXYCODONE-ACETAMINOPHEN 5-325 MG PO TABS
2.0000 | ORAL_TABLET | Freq: Once | ORAL | Status: AC
  Administered 2015-03-10: 2 via ORAL

## 2015-03-10 MED ORDER — OXYCODONE-ACETAMINOPHEN 5-325 MG PO TABS
ORAL_TABLET | ORAL | Status: DC
Start: 2015-03-10 — End: 2015-03-10
  Filled 2015-03-10: qty 2

## 2015-03-10 MED ORDER — DIAZEPAM 5 MG PO TABS
10.0000 mg | ORAL_TABLET | Freq: Once | ORAL | Status: AC
  Administered 2015-03-10: 10 mg via ORAL

## 2015-03-10 MED ORDER — OXYCODONE-ACETAMINOPHEN 5-325 MG PO TABS: 1.0000 | ORAL_TABLET | Freq: Four times a day (QID) | ORAL | Status: DC | PRN

## 2015-03-10 MED ORDER — DIAZEPAM 5 MG PO TABS: 5.0000 mg | ORAL_TABLET | Freq: Three times a day (TID) | ORAL | Status: DC | PRN

## 2015-03-10 NOTE — ED Notes (Signed)
Pt c/o central chest pain radiating to back since Thursday. Pt states pain is worse with coughing. Pt c/o sore throat and pain w/ swallowing. Pt denies fever. Pt denies cardiac hx.

## 2015-03-10 NOTE — ED Provider Notes (Signed)
[facility] Emergency Department Provider Note     Time seen: ----------------------------------------- 6:41 AM on 03/10/2015 -----------------------------------------    I have reviewed the triage vital signs and the nursing notes.   HISTORY  Chief Complaint Chest Pain    HPI Hannah Walton is a 34 y.o. female who presents to ER after central chest pain radiating to her back since Thursday, does a fair amount of heavy lifting on her job. He states pain is worse with coughing or movement. Also describes some pain with swallowing. She has not had a history of this, movement swallowing coughing makes his symptoms worse other makes it better. Pain is severe mostly in the right upper back.    Past Medical History  Diagnosis Date  . Sinus headache   . History of gastric ulcer   . 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 Cyst of knee joint 10/2014    left  . Cyst of left knee joint 10/27/2014    Patient Active Problem List   Diagnosis Date Noted  . Cyst of left knee joint 10/27/2014  . Left leg pain 03/07/2014  . Chronic constipation 03/07/2014  . BACK PAIN, LOW 12/17/2006    Past Surgical History  Procedure Laterality Date  . Upper gi endoscopy    . Knee arthroscopy Left 10/27/2014    Procedure: LEFT KNEE SCOPE SYNOVECTOMY;  Surgeon: Eulas PostJoshua P Landau, MD;  Location: [facility];  Service: Orthopedics;  Laterality: Left;  Marland Kitchen. Mass excision Left 10/27/2014    Procedure: LEFT PCL CYST EXCISION ;  Surgeon: Eulas PostJoshua P Landau, MD;  Location: [facility];  Service: Orthopedics;  Laterality: Left;    Current Outpatient Rx  Name  Route  Sig  Dispense  Refill  . baclofen (LIORESAL) 10 MG tablet   Oral   Take 1 tablet (10 mg total) by mouth 3 (three) times daily. As needed for muscle spasm   50 tablet   0   . ondansetron (ZOFRAN) 4 MG tablet   Oral   Take 1 tablet (4 mg total) by mouth every 8 (eight) hours as needed for  nausea or vomiting.   30 tablet   0   . oxyCODONE-acetaminophen (PERCOCET) 10-325 MG per tablet   Oral   Take 1-2 tablets by mouth every 6 (six) hours as needed for pain. MAXIMUM TOTAL ACETAMINOPHEN DOSE IS 4000 MG PER DAY   75 tablet   0   . sennosides-docusate sodium (SENOKOT-S) 8.6-50 MG tablet   Oral   Take 2 tablets by mouth daily.   30 tablet   1     Allergies Asa and Ibuprofen  Family History  Problem Relation Age of Onset  . Diabetes Maternal Grandmother   . Arthritis Mother   . Arthritis Paternal Grandmother   . Stroke Father   . Stroke Paternal Uncle   . Diabetes Maternal Grandmother   . Diabetes Paternal Uncle     Social History History  Substance Use Topics  . Smoking status: Never Smoker   . Smokeless tobacco: Never Used  . Alcohol Use: No    Review of Systems Constitutional: Negative for fever. Eyes: Negative for visual changes. ENT: Positive for sore throat, positive for neck pain bilaterally Cardiovascular: Positive for chest pain Respiratory: Negative for shortness of breath. Gastrointestinal: Negative for abdominal pain, vomiting and diarrhea. Genitourinary: Negative for dysuria. Musculoskeletal: Positive for back pain worse on the right thoracic Skin: Negative for rash. Neurological: Negative  for headaches, focal weakness or numbness.  10-point ROS otherwise negative.  ____________________________________________   PHYSICAL EXAM:  VITAL SIGNS: ED Triage Vitals  Enc Vitals Group     BP 03/10/15 0505 122/77 mmHg     Pulse Rate 03/10/15 0505 90     Resp 03/10/15 0505 20     Temp 03/10/15 0505 98.4 F (36.9 C)     Temp Source 03/10/15 0505 Oral     SpO2 03/10/15 0505 100 %     Weight 03/10/15 0505 176 lb (79.833 kg)     Height 03/10/15 0505  (1.549 m)     Head Cir --      Peak Flow --      Pain Score 03/10/15 0517 10     Pain Loc --      Pain Edu? --      Excl. in GC? --     Constitutional: Alert and mildly anxious  Well appearing and in no distress. Eyes: Conjunctivae are normal. PERRL. Normal extraocular movements. ENT   Head: Normocephalic and atraumatic.   Nose: No congestion/rhinnorhea.   Mouth/Throat: Mucous membranes are moist. No erythema   Neck: No stridor. No thyromegaly. She has bilateral muscle spasm in the trapezius muscles as well as paracervical muscles. Hematological/Lymphatic/Immunilogical: No cervical lymphadenopathy. Cardiovascular: Normal rate, regular rhythm. Normal and symmetric distal pulses are present in all extremities. No murmurs, rubs, or gallops. Respiratory: Normal respiratory effort without tachypnea nor retractions. Breath sounds are clear and equal bilaterally. No wheezes/rales/rhonchi. Gastrointestinal: Soft and nontender. No distention. No abdominal bruits. There is no CVA tenderness. Musculoskeletal: She has pain with range of motion of her shoulders, she has palpable muscle spasm throughout the thoracic muscles in her back  Neurologic:  Normal speech and language. No gross focal neurologic deficits are appreciated. Speech is normal. No gait instability. Skin:  Skin is warm, dry and intact. No rash noted. Psychiatric: Mood and affect are normal. Speech and behavior are normal. Patient exhibits appropriate insight and judgment. Pain elicited with touching the skin seems out of proportion  ____________________________________________  EKG: EKG interpreted by me normal sinus rhythm with a rate of 79 normal axis normal intervals no evidence of hypertrophy, question will septal infarct.  LABS (pertinent positives/negatives)  Labs Reviewed  BASIC METABOLIC PANEL - Abnormal; Notable for the following:    Glucose, Bld 122 (*)    All other components within normal limits  CBC  TROPONIN I  PREGNANCY, URINE    ____________________________________________  ED COURSE:  Pertinent labs & imaging results that were available during my care of the patient were  reviewed by me and considered in my medical decision making (see chart for details).  Will give Valium, NSAIDs. ____________________________________________   RADIOLOGY  FINDINGS: The cardiomediastinal contours are normal. The lungs are clear. Pulmonary vasculature is normal. No consolidation, pleural effusion, or pneumothorax. No acute osseous abnormalities are seen.  IMPRESSION: No acute pulmonary process.  ____________________________________________    FINAL ASSESSMENT AND PLAN  Torticollis and muscle spasm  Plan: Patient with diffuse muscle spasm across her neck and chest as well as upper back. Values are given for this and advised physical therapy stretching heating pad and back massage. Patient very anxious and he jumps off the bed when I touch her skin even lightly. She is stable for follow-up as an outpatient with her doctor.    Emily Filbert, MD   Emily Filbert, MD 03/10/15 [id number]

## 2015-03-10 NOTE — Discharge Instructions (Signed)
Back Exercises °Back exercises help treat and prevent back injuries. The goal of back exercises is to increase the strength of your abdominal and back muscles and the flexibility of your back. These exercises should be started when you no longer have back pain. Back exercises include: °· Pelvic Tilt. Lie on your back with your knees bent. Tilt your pelvis until the lower part of your back is against the floor. Hold this position 5 to 10 sec and repeat 5 to 10 times. °· Knee to Chest. Pull first 1 knee up against your chest and hold for 20 to 30 seconds, repeat this with the other knee, and then both knees. This may be done with the other leg straight or bent, whichever feels better. °· Sit-Ups or Curl-Ups. Bend your knees 90 degrees. Start with tilting your pelvis, and do a partial, slow sit-up, lifting your trunk only 30 to 45 degrees off the floor. Take at least 2 to 3 seconds for each sit-up. Do not do sit-ups with your knees out straight. If partial sit-ups are difficult, simply do the above but with only tightening your abdominal muscles and holding it as directed. °· Hip-Lift. Lie on your back with your knees flexed 90 degrees. Push down with your feet and shoulders as you raise your hips a couple inches off the floor; hold for 10 seconds, repeat 5 to 10 times. °· Back arches. Lie on your stomach, propping yourself up on bent elbows. Slowly press on your hands, causing an arch in your low back. Repeat 3 to 5 times. Any initial stiffness and discomfort should lessen with repetition over time. °· Shoulder-Lifts. Lie face down with arms beside your body. Keep hips and torso pressed to floor as you slowly lift your head and shoulders off the floor. °Do not overdo your exercises, especially in the beginning. Exercises may cause you some mild back discomfort which lasts for a few minutes; however, if the pain is more severe, or lasts for more than 15 minutes, do not continue exercises until you see your caregiver.  Improvement with exercise therapy for back problems is slow.  °See your caregivers for assistance with developing a proper back exercise program. °Document Released: 11/13/2004 Document Revised: 12/29/2011 Document Reviewed: 08/07/2011 °ExitCare® Patient Information ©2015 [employer]. This information is not intended to replace advice given to you by your health care provider. Make sure you discuss any questions you have with your health care provider. ° °Thoracic Strain °You have injured the muscles or tendons that attach to the upper part of your back behind your chest. This injury is called a thoracic strain, thoracic sprain, or mid-back strain.  °CAUSES  °The cause of thoracic strain varies. A less severe injury involves pulling a muscle or tendon without tearing it. A more severe injury involves tearing (rupturing) a muscle or tendon. With less severe injuries, there may be little loss of strength. Sometimes, there are breaks (fractures) in the bones to which the muscles are attached. These fractures are rare, unless there was a direct hit (trauma) or you have weak bones due to osteoporosis or age. Longstanding strains may be caused by overuse or improper form during certain movements. Obesity can also increase your risk for back injuries. Sudden strains may occur due to injury or not warming up properly before exercise. Often, there is no obvious cause for a thoracic strain. °SYMPTOMS  °The main symptom is pain, especially with movement, such as during exercise. °DIAGNOSIS  °Your caregiver can usually tell   what is wrong by taking an X-ray and doing a physical exam. °TREATMENT  °· Physical therapy may be helpful for recovery. Your caregiver can give you exercises to do or refer you to a physical therapist after your pain improves. °· After your pain improves, strengthening and conditioning programs appropriate for your sport or occupation may be helpful. °· Always warm up before physical activities or  athletics. Stretching after physical activity may also help. °· Certain over-the-counter medicines may also help. Ask your caregiver if there are medicines that would help you. °If this is your first thoracic strain injury, proper care and proper healing time before starting activities should prevent long-term problems. Torn ligaments and tendons require as long to heal as broken bones. Average healing times may be only 1 week for a mild strain. For torn muscles and tendons, healing time may be up to 6 weeks to 2 months. °HOME CARE INSTRUCTIONS  °· Apply ice to the injured area. Ice massages may also be used as directed. °¨ Put ice in a plastic bag. °¨ Place a towel between your skin and the bag. °¨ Leave the ice on for 15-20 minutes, 03-04 times a day, for the first 2 days. °· Only take over-the-counter or prescription medicines for pain, discomfort, or fever as directed by your caregiver. °· Keep your appointments for physical therapy if this was prescribed. °· Use wraps and back braces as instructed. °SEEK IMMEDIATE MEDICAL CARE IF:  °· You have an increase in bruising, swelling, or pain. °· Your pain has not improved with medicines. °· You develop new shortness of breath, chest pain, or fever. °· Problems seem to be getting worse rather than better. °MAKE SURE YOU:  °· Understand these instructions. °· Will watch your condition. °· Will get help right away if you are not doing well or get worse. °Document Released: 12/27/2003 Document Revised: 12/29/2011 Document Reviewed: 11/22/2010 °ExitCare® Patient Information ©2015 [employer]. This information is not intended to replace advice given to you by your health care provider. Make sure you discuss any questions you have with your health care provider. ° °

## 2015-05-08 ENCOUNTER — Telehealth: Payer: Self-pay | Admitting: Family Medicine

## 2015-05-08 DIAGNOSIS — Z0000 Encounter for general adult medical examination without abnormal findings: Secondary | ICD-10-CM | POA: Insufficient documentation

## 2015-05-08 NOTE — Telephone Encounter (Signed)
-----   Message from Alvina Chouerri J Walsh sent at 05/02/2015 12:23 PM EDT ----- Regarding: Lab orders for Wednesday, 7.20.16 Patient is scheduled for CPX labs, please order future labs, Thanks , Camelia Engerri

## 2015-05-09 ENCOUNTER — Other Ambulatory Visit (INDEPENDENT_AMBULATORY_CARE_PROVIDER_SITE_OTHER): Payer: BLUE CROSS/BLUE SHIELD

## 2015-05-09 ENCOUNTER — Other Ambulatory Visit: Payer: BLUE CROSS/BLUE SHIELD

## 2015-05-09 DIAGNOSIS — Z0000 Encounter for general adult medical examination without abnormal findings: Secondary | ICD-10-CM | POA: Diagnosis not present

## 2015-05-09 LAB — CBC WITH DIFFERENTIAL/PLATELET
BASOS ABS: 0 10*3/uL (ref 0.0–0.1)
Basophils Relative: 0.4 % (ref 0.0–3.0)
EOS PCT: 0.6 % (ref 0.0–5.0)
Eosinophils Absolute: 0 10*3/uL (ref 0.0–0.7)
HCT: 38.2 % (ref 36.0–46.0)
Hemoglobin: 12.6 g/dL (ref 12.0–15.0)
LYMPHS ABS: 2.1 10*3/uL (ref 0.7–4.0)
Lymphocytes Relative: 36.6 % (ref 12.0–46.0)
MCHC: 33 g/dL (ref 30.0–36.0)
MCV: 83.3 fl (ref 78.0–100.0)
Monocytes Absolute: 0.4 10*3/uL (ref 0.1–1.0)
Monocytes Relative: 7.5 % (ref 3.0–12.0)
Neutro Abs: 3.2 10*3/uL (ref 1.4–7.7)
Neutrophils Relative %: 54.9 % (ref 43.0–77.0)
Platelets: 268 10*3/uL (ref 150.0–400.0)
RBC: 4.59 Mil/uL (ref 3.87–5.11)
RDW: 14.1 % (ref 11.5–15.5)
WBC: 5.8 10*3/uL (ref 4.0–10.5)

## 2015-05-09 LAB — COMPREHENSIVE METABOLIC PANEL
ALBUMIN: 3.9 g/dL (ref 3.5–5.2)
ALK PHOS: 63 U/L (ref 39–117)
ALT: 14 U/L (ref 0–35)
AST: 19 U/L (ref 0–37)
BUN: 10 mg/dL (ref 6–23)
CALCIUM: 9.1 mg/dL (ref 8.4–10.5)
CO2: 29 mEq/L (ref 19–32)
Chloride: 103 mEq/L (ref 96–112)
Creatinine, Ser: 0.73 mg/dL (ref 0.40–1.20)
GFR: 116.64 mL/min (ref >60.00)
Glucose, Bld: 106 mg/dL — ABNORMAL HIGH (ref 70–99)
POTASSIUM: 4.3 meq/L (ref 3.5–5.1)
Sodium: 137 mEq/L (ref 135–145)
Total Bilirubin: 0.4 mg/dL (ref 0.2–1.2)
Total Protein: 6.9 g/dL (ref 6.0–8.3)

## 2015-05-09 LAB — LIPID PANEL
CHOL/HDL RATIO: 2
Cholesterol: 161 mg/dL (ref 0–200)
HDL: 86.9 mg/dL (ref >39.00)
LDL Cholesterol: 66 mg/dL (ref 0–99)
NonHDL: 74.1
Triglycerides: 41 mg/dL (ref 0.0–149.0)
VLDL: 8.2 mg/dL (ref 0.0–40.0)

## 2015-05-09 LAB — TSH: TSH: 0.65 u[IU]/mL (ref 0.35–4.50)

## 2015-05-16 ENCOUNTER — Ambulatory Visit (INDEPENDENT_AMBULATORY_CARE_PROVIDER_SITE_OTHER): Payer: BLUE CROSS/BLUE SHIELD | Admitting: Family Medicine

## 2015-05-16 ENCOUNTER — Other Ambulatory Visit (HOSPITAL_COMMUNITY)
Admission: RE | Admit: 2015-05-16 | Discharge: 2015-05-16 | Disposition: A | Discharge status: Home / Self Care | Payer: BLUE CROSS/BLUE SHIELD | Source: Ambulatory Visit | Attending: Family Medicine | Admitting: Family Medicine

## 2015-05-16 ENCOUNTER — Encounter: Payer: Self-pay | Admitting: Family Medicine

## 2015-05-16 VITALS — BP 108/66 | HR 83 | Temp 98.6°F | Ht 63.25 in | Wt 180.2 lb

## 2015-05-16 DIAGNOSIS — Z1151 Encounter for screening for human papillomavirus (HPV): Secondary | ICD-10-CM | POA: Diagnosis present

## 2015-05-16 DIAGNOSIS — Z0000 Encounter for general adult medical examination without abnormal findings: Secondary | ICD-10-CM | POA: Diagnosis not present

## 2015-05-16 DIAGNOSIS — Z01419 Encounter for gynecological examination (general) (routine) without abnormal findings: Secondary | ICD-10-CM | POA: Insufficient documentation

## 2015-05-16 LAB — HM PAP SMEAR: HM PAP: NORMAL

## 2015-05-16 MED ORDER — NORETHINDRONE ACET-ETHINYL EST 1-20 MG-MCG PO TABS
1.0000 | ORAL_TABLET | Freq: Every day | ORAL | Status: DC
Start: 2015-05-16 — End: 2015-12-28

## 2015-05-16 NOTE — Assessment & Plan Note (Signed)
Reviewed health habits including diet and exercise and skin cancer prevention Reviewed appropriate screening tests for age  Also reviewed health mt list, fam hx and immunization status , as well as social and family history   See HPI Enc swimming !  Lab reviewed in detail

## 2015-05-16 NOTE — Patient Instructions (Signed)
Start the loestrin the first Sunday after your periods starts  If period starts on a sundy-start it then  If any side effects or problems let me know  It this does not help symptoms after 3 months please let me know  Keep swimming  Over the counter magnesium may help cramps  Drink lots of water

## 2015-05-16 NOTE — Assessment & Plan Note (Signed)
Routine exam with pap  Declines std screening  Will start loestrin for painful menses /cramping -update if no imp  Long discussion re: way to take OC properly and avoidance of smoking  Risks of blood clots outlined as well as possible side eff Pt aware that this does not prevent stds and condoms should still be used inst that it may take up to 3 months for menses to fall into rhythm or side eff to stop  Adv to call if problems or questions

## 2015-05-16 NOTE — Progress Notes (Signed)
Pre visit review using our clinic review tool, if applicable. No additional management support is needed unless otherwise documented below in the visit note. 

## 2015-05-16 NOTE — Progress Notes (Signed)
Subjective:    Patient ID: Hannah Walton, female    DOB: [birth year], 34 y.o.   MRN: [id number]  HPI Here for health maintenance exam and to review chronic medical problems   Had knee surgery in Jan Getting slowly better  Has been working a lot   Having a lot of muscle spasms  Swimming is helping - just learned how to swim and happy with that    Wt is up 4 lb with bmi of 31  Is working on that with exercise  Is eating too much - she snacks at night  Too much cheese    HIV screen- declines/not high risk   Flu vaccine - does not get them   Pap nl 10/14 and neg for HPV  occ gets pelvic pain /cramps - after her period  Period lasts 1 week -not heavy  Interested in birth control - hard to remember it (daily pill) Has used loestrin (worked well)  Interested in trying again  Non smoker    Td 6/15   Results for orders placed or performed in visit on 05/09/15  CBC with Differential/Platelet  Result Value Ref Range   WBC 5.8 4.0 - 10.5 K/uL   RBC 4.59 3.87 - 5.11 Mil/uL   Hemoglobin 12.6 12.0 - 15.0 g/dL   HCT 40.9 81.1 - 91.4 %   MCV 83.3 78.0 - 100.0 fl   MCHC 33.0 30.0 - 36.0 g/dL   RDW 78.2 95.6 - 21.3 %   Platelets 268.0 150.0 - 400.0 K/uL   Neutrophils Relative % 54.9 43.0 - 77.0 %   Lymphocytes Relative 36.6 12.0 - 46.0 %   Monocytes Relative 7.5 3.0 - 12.0 %   Eosinophils Relative 0.6 0.0 - 5.0 %   Basophils Relative 0.4 0.0 - 3.0 %   Neutro Abs 3.2 1.4 - 7.7 K/uL   Lymphs Abs 2.1 0.7 - 4.0 K/uL   Monocytes Absolute 0.4 0.1 - 1.0 K/uL   Eosinophils Absolute 0.0 0.0 - 0.7 K/uL   Basophils Absolute 0.0 0.0 - 0.1 K/uL  Comprehensive metabolic panel  Result Value Ref Range   Sodium 137 135 - 145 mEq/L   Potassium 4.3 3.5 - 5.1 mEq/L   Chloride 103 96 - 112 mEq/L   CO2 29 19 - 32 mEq/L   Glucose, Bld 106 (H) 70 - 99 mg/dL   BUN 10 6 - 23 mg/dL   Creatinine, Ser 0.86 0.40 - 1.20 mg/dL   Total Bilirubin 0.4 0.2 - 1.2 mg/dL   Alkaline Phosphatase 63  39 - 117 U/L   AST 19 0 - 37 U/L   ALT 14 0 - 35 U/L   Total Protein 6.9 6.0 - 8.3 g/dL   Albumin 3.9 3.5 - 5.2 g/dL   Calcium 9.1 8.4 - 57.8 mg/dL   GFR 469.62 >95.28 mL/min  Lipid panel  Result Value Ref Range   Cholesterol 161 0 - 200 mg/dL   Triglycerides 41.3 0.0 - 149.0 mg/dL   HDL 24.40 >10.27 mg/dL   VLDL 8.2 0.0 - 25.3 mg/dL   LDL Cholesterol 66 0 - 99 mg/dL   Total CHOL/HDL Ratio 2    NonHDL 74.10   TSH  Result Value Ref Range   TSH 0.65 0.35 - 4.50 uIU/mL    Patient Active Problem List   Diagnosis Date Noted  . Encounter for routine gynecological examination 05/16/2015  . Routine general medical examination at a health care facility 05/08/2015  . Cyst of left  knee joint 10/27/2014  . Left leg pain 03/07/2014  . Chronic constipation 03/07/2014  . BACK PAIN, LOW 12/17/2006   Past Medical History  Diagnosis Date  . Sinus headache   . History of gastric ulcer   . 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 Cyst of knee joint 10/2014    left  . Cyst of left knee joint 10/27/2014   Past Surgical History  Procedure Laterality Date  . Upper gi endoscopy    . Knee arthroscopy Left 10/27/2014    Procedure: LEFT KNEE SCOPE SYNOVECTOMY;  Surgeon: Eulas Post, MD;  Location: [facility];  Service: Orthopedics;  Laterality: Left;  Marland Kitchen Mass excision Left 10/27/2014    Procedure: LEFT PCL CYST EXCISION ;  Surgeon: Eulas Post, MD;  Location: [facility];  Service: Orthopedics;  Laterality: Left;   History  Substance Use Topics  . Smoking status: Never Smoker   . Smokeless tobacco: Never Used  . Alcohol Use: No   Family History  Problem Relation Age of Onset  . Diabetes Maternal Grandmother   . Arthritis Mother   . Arthritis Paternal Grandmother   . Stroke Father   . Stroke Paternal Uncle   . Diabetes Maternal Grandmother   . Diabetes Paternal Uncle    Allergies  Allergen Reactions  . Asa [Aspirin] Other (See Comments)    DUE TO HX. OF  GASTRIC ULCER  . Ibuprofen Other (See Comments)    DUE TO HX. OF GASTRIC ULCER   Current Outpatient Prescriptions on File Prior to Visit  Medication Sig Dispense Refill  . diazepam (VALIUM) 5 MG tablet Take 1 tablet (5 mg total) by mouth every 8 (eight) hours as needed for muscle spasms. 20 tablet 0   No current facility-administered medications on file prior to visit.    Review of Systems Review of Systems  Constitutional: Negative for fever, appetite change, fatigue and unexpected weight change.  Eyes: Negative for pain and visual disturbance.  Respiratory: Negative for cough and shortness of breath.   Cardiovascular: Negative for cp or palpitations    Gastrointestinal: Negative for nausea, diarrhea and constipation.  Genitourinary: Negative for urgency and frequency. pos for cramping with menses  Skin: Negative for pallor or rash   Neurological: Negative for weakness, light-headedness, numbness and headaches.  Hematological: Negative for adenopathy. Does not bruise/bleed easily.  Psychiatric/Behavioral: Negative for dysphoric mood. The patient is not nervous/anxious.         Objective:   Physical Exam  Constitutional: She appears well-developed and well-nourished. No distress.  obese and well appearing   HENT:  Head: Normocephalic and atraumatic.  Right Ear: External ear normal.  Left Ear: External ear normal.  Mouth/Throat: Oropharynx is clear and moist.  Eyes: Conjunctivae and EOM are normal. Pupils are equal, round, and reactive to light. No scleral icterus.  Neck: Normal range of motion. Neck supple. No JVD present. Carotid bruit is not present. No thyromegaly present.  Cardiovascular: Normal rate, regular rhythm, normal heart sounds and intact distal pulses.  Exam reveals no gallop.   Pulmonary/Chest: Effort normal and breath sounds normal. No respiratory distress. She has no wheezes. She exhibits no tenderness.  Abdominal: Soft. Bowel sounds are normal. She exhibits no  distension, no abdominal bruit and no mass. There is no tenderness.  Genitourinary: Vagina normal and uterus normal. No breast swelling, tenderness, discharge or bleeding. There is no rash, tenderness or lesion on the right labia. There is no  rash, tenderness or lesion on the left labia. Uterus is not enlarged and not tender. Cervix exhibits no motion tenderness, no discharge and no friability. Right adnexum displays no mass, no tenderness and no fullness. Left adnexum displays no mass, no tenderness and no fullness. No bleeding in the vagina. No vaginal discharge found.  Breast exam: No mass, nodules, thickening, tenderness, bulging, retraction, inflamation, nipple discharge or skin changes noted.  No axillary or clavicular LA.      Musculoskeletal: Normal range of motion. She exhibits no edema or tenderness.  Lymphadenopathy:    She has no cervical adenopathy.  Neurological: She is alert. She has normal reflexes. No cranial nerve deficit. She exhibits normal muscle tone. Coordination normal.  Skin: Skin is warm and dry. No rash noted. No erythema. No pallor.  Psychiatric: She has a normal mood and affect.          Assessment & Plan:   Problem List Items Addressed This Visit    Encounter for routine gynecological examination    Routine exam with pap  Declines std screening  Will start loestrin for painful menses /cramping -update if no imp  Long discussion re: way to take OC properly and avoidance of smoking  Risks of blood clots outlined as well as possible side eff Pt aware that this does not prevent stds and condoms should still be used inst that it may take up to 3 months for menses to fall into rhythm or side eff to stop  Adv to call if problems or questions         Relevant Orders   Cytology - PAP   Routine general medical examination at a health care facility - Primary    Reviewed health habits including diet and exercise and skin cancer prevention Reviewed appropriate  screening tests for age  Also reviewed health mt list, fam hx and immunization status , as well as social and family history   See HPI Enc swimming !  Lab reviewed in detail

## 2015-05-18 LAB — CYTOLOGY - PAP

## 2015-05-23 ENCOUNTER — Encounter: Payer: Self-pay | Admitting: *Deleted

## 2015-05-23 ENCOUNTER — Encounter: Payer: Self-pay | Admitting: Family Medicine

## 2015-11-02 ENCOUNTER — Emergency Department
Admission: EM | Admit: 2015-11-02 | Discharge: 2015-11-02 | Disposition: A | Discharge status: Home / Self Care | Payer: BLUE CROSS/BLUE SHIELD | Attending: Emergency Medicine | Admitting: Emergency Medicine

## 2015-11-02 DIAGNOSIS — Z79899 Other long term (current) drug therapy: Secondary | ICD-10-CM | POA: Diagnosis not present

## 2015-11-02 DIAGNOSIS — J0100 Acute maxillary sinusitis, unspecified: Secondary | ICD-10-CM | POA: Insufficient documentation

## 2015-11-02 DIAGNOSIS — R0981 Nasal congestion: Secondary | ICD-10-CM | POA: Diagnosis present

## 2015-11-02 MED ORDER — AMOXICILLIN-POT CLAVULANATE 875-125 MG PO TABS: 1.0000 | ORAL_TABLET | Freq: Two times a day (BID) | ORAL | Status: DC

## 2015-11-02 MED ORDER — OXYMETAZOLINE HCL 0.05 % NA SOLN
1.0000 | Freq: Once | NASAL | Status: AC
  Administered 2015-11-02: 1 via NASAL
  Filled 2015-11-02: qty 15

## 2015-11-02 MED ORDER — AMOXICILLIN 500 MG PO CAPS: 1000.0000 mg | ORAL_CAPSULE | Freq: Once | ORAL | Status: DC

## 2015-11-02 MED ORDER — PREDNISONE 20 MG PO TABS
60.0000 mg | ORAL_TABLET | Freq: Once | ORAL | Status: AC
  Administered 2015-11-02: 60 mg via ORAL
  Filled 2015-11-02: qty 3

## 2015-11-02 MED ORDER — AMOXICILLIN-POT CLAVULANATE 875-125 MG PO TABS
1.0000 | ORAL_TABLET | Freq: Once | ORAL | Status: AC
  Administered 2015-11-02: 1 via ORAL
  Filled 2015-11-02: qty 1

## 2015-11-02 MED ORDER — PREDNISONE 10 MG PO TABS: 10.0000 mg | ORAL_TABLET | Freq: Every day | ORAL | Status: DC

## 2015-11-02 NOTE — ED Provider Notes (Signed)
CSN: [id number]     Arrival date & time 11/02/15  1948 History   First MD Initiated Contact with Patient 11/02/15 2129     Chief Complaint  Patient presents with  . Nasal Congestion  . Facial Pain     (Consider location/radiation/quality/duration/timing/severity/associated sxs/prior Treatment) HPI  35 year old female with 3 weeks of nasal congestion and sinus pain and pressure. She has tried over-the-counter medications with no improvement. She is not been on antibiotics. She denies any fevers, coughing, chest pain, shortness of breath. She denies any nausea vomiting diarrhea or abdominal pain. Sinus pain is 8 out of 10 described as pressure and increased with head movement. She denies any neck pain.  Past Medical History  Diagnosis Date  . Sinus headache   . History of gastric ulcer   . 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 Cyst of knee joint 10/2014    left  . Cyst of left knee joint 10/27/2014   Past Surgical History  Procedure Laterality Date  . Upper gi endoscopy    . Knee arthroscopy Left 10/27/2014    Procedure: LEFT KNEE SCOPE SYNOVECTOMY;  Surgeon: Eulas PostJoshua P Landau, MD;  Location: [facility];  Service: Orthopedics;  Laterality: Left;  Marland Kitchen. Mass excision Left 10/27/2014    Procedure: LEFT PCL CYST EXCISION ;  Surgeon: Eulas PostJoshua P Landau, MD;  Location: [facility];  Service: Orthopedics;  Laterality: Left;   Family History  Problem Relation Age of Onset  . Diabetes Maternal Grandmother   . Arthritis Mother   . Arthritis Paternal Grandmother   . Stroke Father   . Stroke Paternal Uncle   . Diabetes Maternal Grandmother   . Diabetes Paternal Uncle    Social History  Substance Use Topics  . Smoking status: Never Smoker   . Smokeless tobacco: Never Used  . Alcohol Use: No   OB History    No data available     Review of Systems  Constitutional: Negative for fever, chills, activity change and fatigue.  HENT: Positive for congestion, rhinorrhea and  sinus pressure. Negative for sore throat.   Eyes: Negative for visual disturbance.  Respiratory: Negative for cough, chest tightness and shortness of breath.   Cardiovascular: Negative for chest pain and leg swelling.  Gastrointestinal: Negative for nausea, vomiting, abdominal pain and diarrhea.  Genitourinary: Negative for dysuria.  Musculoskeletal: Negative for arthralgias and gait problem.  Skin: Negative for rash.  Neurological: Negative for weakness, numbness and headaches.  Hematological: Negative for adenopathy.  Psychiatric/Behavioral: Negative for behavioral problems, confusion and agitation.      Allergies  Asa and Ibuprofen  Home Medications   Prior to Admission medications   Medication Sig Start Date End Date Taking? Authorizing Provider  amoxicillin-clavulanate (AUGMENTIN) 875-125 MG tablet Take 1 tablet by mouth 2 (two) times daily. 11/02/15   Evon Slackhomas C Gaines, PA-C  diazepam (VALIUM) 5 MG tablet Take 1 tablet (5 mg total) by mouth every 8 (eight) hours as needed for muscle spasms. 03/10/15   Emily FilbertJonathan E Williams, MD  norethindrone-ethinyl estradiol (MICROGESTIN,JUNEL,LOESTRIN) 1-20 MG-MCG tablet Take 1 tablet by mouth daily. 05/16/15   Judy PimpleMarne A Tower, MD  predniSONE (DELTASONE) 10 MG tablet Take 1 tablet (10 mg total) by mouth daily. 6,5,4,3,2,1 six day taper 11/02/15   Evon Slackhomas C Gaines, PA-C   BP 130/82 mmHg  Pulse 86  Temp(Src) 99.5 F (37.5 C) (Oral)  Resp 20  Ht 5\' 2"  (1.575 m)  Wt 81.647 kg  BMI  32.91 kg/m2  SpO2 100%  LMP 09/02/2015 Physical Exam  Constitutional: She is oriented to person, place, and time. She appears well-developed and well-nourished. No distress.  HENT:  Head: Normocephalic and atraumatic.  Nose: Mucosal edema and rhinorrhea present. No nasal deformity, septal deviation or nasal septal hematoma.  No foreign bodies.  Mouth/Throat: Oropharynx is clear and moist. No oropharyngeal exudate.  Positive frontal and maxillary sinus tenderness  Eyes:  EOM are normal. Pupils are equal, round, and reactive to light. Right eye exhibits no discharge. Left eye exhibits no discharge.  Neck: Normal range of motion. Neck supple.  Cardiovascular: Normal rate, regular rhythm and intact distal pulses.   Pulmonary/Chest: Effort normal and breath sounds normal. No respiratory distress. She has no wheezes. She has no rales. She exhibits no tenderness.  Musculoskeletal: Normal range of motion. She exhibits no edema.  Neurological: She is alert and oriented to person, place, and time. She has normal reflexes.  Skin: Skin is warm and dry.  Psychiatric: She has a normal mood and affect. Her behavior is normal. Thought content normal.    ED Course  Procedures (including critical care time) Labs Review Labs Reviewed - No data to display  Imaging Review No results found. I have personally reviewed and evaluated these images and lab results as part of my medical decision-making.   EKG Interpretation None      MDM   Final diagnoses:  Acute maxillary sinusitis, recurrence not specified    35 year old female with 3 weeks of sinus pain and pressure and congestion. She is treated for sinusitis. Augmentin 875-125 milligrams tablet twice a day for 10 days. She is given steroids to help with inflammation. She is also given Afrin. She is educated on rebound congestion. Return to the ER for any worsening symptoms or urgent changes in her health.    Evon Slack, PA-C 11/02/15 2230  Darien Ramus, MD 11/02/15 [phone]

## 2015-11-02 NOTE — Discharge Instructions (Signed)
Sinusitis, Adult °Sinusitis is redness, soreness, and inflammation of the paranasal sinuses. Paranasal sinuses are air pockets within the bones of your face. They are located beneath your eyes, in the middle of your forehead, and above your eyes. In healthy paranasal sinuses, mucus is able to drain out, and air is able to circulate through them by way of your nose. However, when your paranasal sinuses are inflamed, mucus and air can become trapped. This can allow bacteria and other germs to grow and cause infection. °Sinusitis can develop quickly and last only a short time (acute) or continue over a long period (chronic). Sinusitis that lasts for more than 12 weeks is considered chronic. °CAUSES °Causes of sinusitis include: °· Allergies. °· Structural abnormalities, such as displacement of the cartilage that separates your nostrils (deviated septum), which can decrease the air flow through your nose and sinuses and affect sinus drainage. °· Functional abnormalities, such as when the small hairs (cilia) that line your sinuses and help remove mucus do not work properly or are not present. °SIGNS AND SYMPTOMS °Symptoms of acute and chronic sinusitis are the same. The primary symptoms are pain and pressure around the affected sinuses. Other symptoms include: °· Upper toothache. °· Earache. °· Headache. °· Bad breath. °· Decreased sense of smell and taste. °· A cough, which worsens when you are lying flat. °· Fatigue. °· Fever. °· Thick drainage from your nose, which often is green and may contain pus (purulent). °· Swelling and warmth over the affected sinuses. °DIAGNOSIS °Your health care provider will perform a physical exam. During your exam, your health care provider may perform any of the following to help determine if you have acute sinusitis or chronic sinusitis: °· Look in your nose for signs of abnormal growths in your nostrils (nasal polyps). °· Tap over the affected sinus to check for signs of  infection. °· View the inside of your sinuses using an imaging device that has a light attached (endoscope). °If your health care provider suspects that you have chronic sinusitis, one or more of the following tests may be recommended: °· Allergy tests. °· Nasal culture. A sample of mucus is taken from your nose, sent to a lab, and screened for bacteria. °· Nasal cytology. A sample of mucus is taken from your nose and examined by your health care provider to determine if your sinusitis is related to an allergy. °TREATMENT °Most cases of acute sinusitis are related to a viral infection and will resolve on their own within 10 days. Sometimes, medicines are prescribed to help relieve symptoms of both acute and chronic sinusitis. These may include pain medicines, decongestants, nasal steroid sprays, or saline sprays. °However, for sinusitis related to a bacterial infection, your health care provider will prescribe antibiotic medicines. These are medicines that will help kill the bacteria causing the infection. °Rarely, sinusitis is caused by a fungal infection. In these cases, your health care provider will prescribe antifungal medicine. °For some cases of chronic sinusitis, surgery is needed. Generally, these are cases in which sinusitis recurs more than 3 times per year, despite other treatments. °HOME CARE INSTRUCTIONS °· Drink plenty of water. Water helps thin the mucus so your sinuses can drain more easily. °· Use a humidifier. °· Inhale steam 3-4 times a day (for example, sit in the bathroom with the shower running). °· Apply a warm, moist washcloth to your face 3-4 times a day, or as directed by your health care provider. °· Use saline nasal sprays to help   moisten and clean your sinuses.  Take medicines only as directed by your health care provider.  If you were prescribed either an antibiotic or antifungal medicine, finish it all even if you start to feel better. SEEK IMMEDIATE MEDICAL CARE IF:  You have  increasing pain or severe headaches.  You have nausea, vomiting, or drowsiness.  You have swelling around your face.  You have vision problems.  You have a stiff neck.  You have difficulty breathing.   This information is not intended to replace advice given to you by your health care provider. Make sure you discuss any questions you have with your health care provider.   Document Released: 10/06/2005 Document Revised: 10/27/2014 Document Reviewed: 10/21/2011 Elsevier Interactive Patient Education 2016 [employer]  Sinus Rinse WHAT IS A SINUS RINSE? A sinus rinse is a simple home treatment that is used to rinse your sinuses with a sterile mixture of salt and water (saline solution). Sinuses are air-filled spaces in your skull behind the bones of your face and forehead that open into your nasal cavity. You will use the following:  Saline solution.  Neti pot or spray bottle. This releases the saline solution into your nose and through your sinuses. Neti pots and spray bottles can be purchased at [employer], a health food store, or online. WHEN WOULD I DO A SINUS RINSE? A sinus rinse can help to clear mucus, dirt, dust, or pollen from the nasal cavity. You may do a sinus rinse when you have a cold, a virus, nasal allergy symptoms, a sinus infection, or stuffiness in the nose or sinuses. If you are considering a sinus rinse:  Ask your child's health care provider before performing a sinus rinse on your child.  Do not do a sinus rinse if you have had ear or nasal surgery, ear infection, or blocked ears. HOW DO I DO A SINUS RINSE?  Wash your hands.  Disinfect your device according to the directions provided and then dry it.  Use the solution that comes with your device or one that is sold separately in stores. Follow the mixing directions on the package.  Fill your device with the amount of saline solution as directed by the device instructions.  Stand over a sink and  tilt your head sideways over the sink.  Place the spout of the device in your upper nostril (the one closer to the ceiling).  Gently pour or squeeze the saline solution into the nasal cavity. The liquid should drain to the lower nostril if you are not overly congested.  Gently blow your nose. Blowing too hard may cause ear pain.  Repeat in the other nostril.  Clean and rinse your device with clean water and then air-dry it. ARE THERE RISKS OF A SINUS RINSE?  Sinus rinse is generally very safe and effective. However, there are a few risks, which include:   A burning sensation in the sinuses. This may happen if you do not make the saline solution as directed. Make sure to follow all directions when making the saline solution.  Infection from contaminated water. This is rare, but possible.  Nasal irritation.   This information is not intended to replace advice given to you by your health care provider. Make sure you discuss any questions you have with your health care provider.   Document Released: 05/03/2014 Document Reviewed: 05/03/2014 Elsevier Interactive Patient Education 2016 [employer].  Sinusitis, Adult Sinusitis is redness, soreness, and inflammation of the paranasal sinuses. Paranasal  sinuses are air pockets within the bones of your face. They are located beneath your eyes, in the middle of your forehead, and above your eyes. In healthy paranasal sinuses, mucus is able to drain out, and air is able to circulate through them by way of your nose. However, when your paranasal sinuses are inflamed, mucus and air can become trapped. This can allow bacteria and other germs to grow and cause infection. Sinusitis can develop quickly and last only a short time (acute) or continue over a long period (chronic). Sinusitis that lasts for more than 12 weeks is considered chronic. CAUSES Causes of sinusitis include:  Allergies.  Structural abnormalities, such as displacement of the  cartilage that separates your nostrils (deviated septum), which can decrease the air flow through your nose and sinuses and affect sinus drainage.  Functional abnormalities, such as when the small hairs (cilia) that line your sinuses and help remove mucus do not work properly or are not present. SIGNS AND SYMPTOMS Symptoms of acute and chronic sinusitis are the same. The primary symptoms are pain and pressure around the affected sinuses. Other symptoms include:  Upper toothache.  Earache.  Headache.  Bad breath.  Decreased sense of smell and taste.  A cough, which worsens when you are lying flat.  Fatigue.  Fever.  Thick drainage from your nose, which often is green and may contain pus (purulent).  Swelling and warmth over the affected sinuses. DIAGNOSIS Your health care provider will perform a physical exam. During your exam, your health care provider may perform any of the following to help determine if you have acute sinusitis or chronic sinusitis:  Look in your nose for signs of abnormal growths in your nostrils (nasal polyps).  Tap over the affected sinus to check for signs of infection.  View the inside of your sinuses using an imaging device that has a light attached (endoscope). If your health care provider suspects that you have chronic sinusitis, one or more of the following tests may be recommended:  Allergy tests.  Nasal culture. A sample of mucus is taken from your nose, sent to a lab, and screened for bacteria.  Nasal cytology. A sample of mucus is taken from your nose and examined by your health care provider to determine if your sinusitis is related to an allergy. TREATMENT Most cases of acute sinusitis are related to a viral infection and will resolve on their own within 10 days. Sometimes, medicines are prescribed to help relieve symptoms of both acute and chronic sinusitis. These may include pain medicines, decongestants, nasal steroid sprays, or saline  sprays. However, for sinusitis related to a bacterial infection, your health care provider will prescribe antibiotic medicines. These are medicines that will help kill the bacteria causing the infection. Rarely, sinusitis is caused by a fungal infection. In these cases, your health care provider will prescribe antifungal medicine. For some cases of chronic sinusitis, surgery is needed. Generally, these are cases in which sinusitis recurs more than 3 times per year, despite other treatments. HOME CARE INSTRUCTIONS  Drink plenty of water. Water helps thin the mucus so your sinuses can drain more easily.  Use a humidifier.  Inhale steam 3-4 times a day (for example, sit in the bathroom with the shower running).  Apply a warm, moist washcloth to your face 3-4 times a day, or as directed by your health care provider.  Use saline nasal sprays to help moisten and clean your sinuses.  Take medicines only as directed by  your health care provider.  If you were prescribed either an antibiotic or antifungal medicine, finish it all even if you start to feel better. SEEK IMMEDIATE MEDICAL CARE IF:  You have increasing pain or severe headaches.  You have nausea, vomiting, or drowsiness.  You have swelling around your face.  You have vision problems.  You have a stiff neck.  You have difficulty breathing.   This information is not intended to replace advice given to you by your health care provider. Make sure you discuss any questions you have with your health care provider.   Document Released: 10/06/2005 Document Revised: 10/27/2014 Document Reviewed: 10/21/2011 Elsevier Interactive Patient Education [employer]

## 2015-11-02 NOTE — ED Notes (Signed)
Pt states three weeks of facial pain, nasal congestion. Pt states difficulty breathing through nose. Pt appears in no acute distress in triage.

## 2015-11-02 NOTE — ED Notes (Signed)
Sinus congestion noted. 

## 2015-11-05 ENCOUNTER — Ambulatory Visit: Payer: BLUE CROSS/BLUE SHIELD | Admitting: Family Medicine

## 2015-11-26 ENCOUNTER — Encounter: Payer: Self-pay | Admitting: Family Medicine

## 2015-11-26 ENCOUNTER — Ambulatory Visit (INDEPENDENT_AMBULATORY_CARE_PROVIDER_SITE_OTHER): Payer: BLUE CROSS/BLUE SHIELD | Admitting: Family Medicine

## 2015-11-26 VITALS — BP 122/70 | HR 62 | Temp 97.8°F | Ht 63.25 in | Wt 191.8 lb

## 2015-11-26 DIAGNOSIS — N912 Amenorrhea, unspecified: Secondary | ICD-10-CM | POA: Diagnosis not present

## 2015-11-26 DIAGNOSIS — R300 Dysuria: Secondary | ICD-10-CM | POA: Insufficient documentation

## 2015-11-26 DIAGNOSIS — Z793 Long term (current) use of hormonal contraceptives: Secondary | ICD-10-CM

## 2015-11-26 LAB — POC URINALSYSI DIPSTICK (AUTOMATED)
Bilirubin, UA: NEGATIVE
Glucose, UA: NEGATIVE
KETONES UA: NEGATIVE
LEUKOCYTES UA: NEGATIVE
Nitrite, UA: NEGATIVE
PH UA: 6
Urobilinogen, UA: 0.2

## 2015-11-26 LAB — POCT URINE PREGNANCY: Preg Test, Ur: NEGATIVE

## 2015-11-26 MED ORDER — DROSPIRENONE-ETHINYL ESTRADIOL 3-0.03 MG PO TABS: 1.0000 | ORAL_TABLET | Freq: Every day | ORAL | Status: DC

## 2015-11-26 NOTE — Progress Notes (Signed)
Pre visit review using our clinic review tool, if applicable. No additional management support is needed unless otherwise documented below in the visit note. 

## 2015-11-26 NOTE — Assessment & Plan Note (Signed)
On lo estrin  Neg urine preg test  Will change OC to yasmin  Disc poss side eff-update if problems Non smoker -disc risk of OC with smoking  If no improvement after 3 mo update  Would consider labs / prog w/d test if this continues

## 2015-11-26 NOTE — Patient Instructions (Addendum)
Increase water intake - if urinary symptoms do not improve let me know  Let's change your pill  Will try generic of yasmin - start it after finishing this cycle If any intolerable side effects please let me know   If periods don't start to normalize in the next 3 months please let me know

## 2015-11-26 NOTE — Progress Notes (Signed)
Subjective:    Patient ID: Hannah Walton, female    DOB: 18-Feb-1981, 35 y.o.   MRN: [id number]  HPI Here for irregular cycles   Has not had one since Oct   Periods were regular before pill   Then on the pill before Oct - did not notice any difference  Period usually lasts 4-5 days-pretty light after the first day   Missed one pill and doubled up the next day   Wt is up 11 lb   Feels fine   Results for orders placed or performed in visit on 11/26/15  POCT Urinalysis Dipstick (Automated)  Result Value Ref Range   Color, UA Yellow    Clarity, UA Clear    Glucose, UA Neg.    Bilirubin, UA Neg.    Ketones, UA Neg.    Spec Grav, UA >=1.030    Blood, UA 10 Ery/uL    pH, UA 6.0    Protein, UA Trace    Urobilinogen, UA 0.2    Nitrite, UA Neg.    Leukocytes, UA Negative Negative  POCT urine pregnancy  Result Value Ref Range   Preg Test, Ur Negative Negative     Patient Active Problem List   Diagnosis Date Noted  . Dysuria 11/26/2015  . Amenorrhea 11/26/2015  . Encounter for routine gynecological examination 05/16/2015  . Routine general medical examination at a health care facility 05/08/2015  . Cyst of left knee joint 10/27/2014  . Left leg pain 03/07/2014  . Chronic constipation 03/07/2014  . BACK PAIN, LOW 12/17/2006   Past Medical History  Diagnosis Date  . Sinus headache   . History of gastric ulcer   . 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 Cyst of knee joint 10/2014    left  . Cyst of left knee joint 10/27/2014   Past Surgical History  Procedure Laterality Date  . Upper gi endoscopy    . Knee arthroscopy Left 10/27/2014    Procedure: LEFT KNEE SCOPE SYNOVECTOMY;  Surgeon: Eulas Post, MD;  Location: [facility];  Service: Orthopedics;  Laterality: Left;  Marland Kitchen Mass excision Left 10/27/2014    Procedure: LEFT PCL CYST EXCISION ;  Surgeon: Eulas Post, MD;  Location: [facility];  Service: Orthopedics;  Laterality: Left;     Social History  Substance Use Topics  . Smoking status: Never Smoker   . Smokeless tobacco: Never Used  . Alcohol Use: No   Family History  Problem Relation Age of Onset  . Diabetes Maternal Grandmother   . Arthritis Mother   . Arthritis Paternal Grandmother   . Stroke Father   . Stroke Paternal Uncle   . Diabetes Maternal Grandmother   . Diabetes Paternal Uncle    Allergies  Allergen Reactions  . Asa [Aspirin] Other (See Comments)    DUE TO HX. OF GASTRIC ULCER  . Ibuprofen Other (See Comments)    DUE TO HX. OF GASTRIC ULCER   Current Outpatient Prescriptions on File Prior to Visit  Medication Sig Dispense Refill  . diazepam (VALIUM) 5 MG tablet Take 1 tablet (5 mg total) by mouth every 8 (eight) hours as needed for muscle spasms. 20 tablet 0   No current facility-administered medications on file prior to visit.    Review of Systems    Review of Systems  Constitutional: Negative for fever, appetite change, fatigue and unexpected weight change.  Eyes: Negative for pain and visual disturbance.  Respiratory:  Negative for cough and shortness of breath.   Cardiovascular: Negative for cp or palpitations    Gastrointestinal: Negative for nausea, diarrhea and constipation.  Genitourinary: Negative for urgency and frequency. pos for occ dysuria / neg for hematuria  Skin: Negative for pallor or rash   Neurological: Negative for weakness, light-headedness, numbness and headaches.  Hematological: Negative for adenopathy. Does not bruise/bleed easily.  Psychiatric/Behavioral: Negative for dysphoric mood. The patient is not nervous/anxious.      Objective:   Physical Exam  Constitutional: She appears well-developed and well-nourished. No distress.  obese and well appearing   HENT:  Head: Normocephalic and atraumatic.  Eyes: Conjunctivae and EOM are normal. Pupils are equal, round, and reactive to light.  Neck: Normal range of motion. Neck supple.  Cardiovascular: Normal  rate, regular rhythm and normal heart sounds.   Pulmonary/Chest: Effort normal and breath sounds normal.  Abdominal: Soft. Bowel sounds are normal. She exhibits no distension. There is no tenderness. There is no rebound.  No cva tenderness  No  suprapubic tenderness or fullness   Musculoskeletal: She exhibits no edema.  Lymphadenopathy:    She has no cervical adenopathy.  Neurological: She is alert.  Skin: No rash noted.  Psychiatric: She has a normal mood and affect.          Assessment & Plan:   Problem List Items Addressed This Visit      Other   Amenorrhea    On lo estrin  Neg urine preg test  Will change OC to yasmin  Disc poss side eff-update if problems Non smoker -disc risk of OC with smoking  If no improvement after 3 mo update  Would consider labs / prog w/d test if this continues       Relevant Orders   POCT urine pregnancy (Completed)   Dysuria - Primary    ua looks clear -but high SG Enc inc water intake Update if no improvement  Denies vaginal d/c or std exp      Relevant Orders   POCT Urinalysis Dipstick (Automated) (Completed)

## 2015-11-26 NOTE — Assessment & Plan Note (Signed)
ua looks clear -but high SG Enc inc water intake Update if no improvement  Denies vaginal d/c or std exp

## 2015-12-28 ENCOUNTER — Telehealth: Payer: Self-pay | Admitting: *Deleted

## 2015-12-28 MED ORDER — NORETHINDRONE ACET-ETHINYL EST 1-20 MG-MCG PO TABS: 1.0000 | ORAL_TABLET | Freq: Every day | ORAL | Status: DC

## 2015-12-28 NOTE — Telephone Encounter (Signed)
Left voicemail letting pt know Rx sent 

## 2015-12-28 NOTE — Telephone Encounter (Signed)
Will send in [place] [facility]

## 2015-12-28 NOTE — Telephone Encounter (Signed)
Pt left vm on triage, requesting to change BCP and switch back to [facility]. Pt states new BCP is causing nausea, and if possible, she request Rx be sent to pharmacy before end of day as she is to start new pack on Sunday. Pt request call back

## 2016-03-26 DIAGNOSIS — M7742 Metatarsalgia, left foot: Secondary | ICD-10-CM | POA: Diagnosis not present

## 2016-03-26 DIAGNOSIS — M25562 Pain in left knee: Secondary | ICD-10-CM | POA: Diagnosis not present

## 2016-06-11 ENCOUNTER — Encounter: Payer: Self-pay | Admitting: Family Medicine

## 2016-06-11 ENCOUNTER — Ambulatory Visit (INDEPENDENT_AMBULATORY_CARE_PROVIDER_SITE_OTHER): Payer: BLUE CROSS/BLUE SHIELD | Admitting: Family Medicine

## 2016-06-11 ENCOUNTER — Other Ambulatory Visit (HOSPITAL_COMMUNITY)
Admission: RE | Admit: 2016-06-11 | Discharge: 2016-06-11 | Disposition: A | Discharge status: Home / Self Care | Payer: BLUE CROSS/BLUE SHIELD | Source: Ambulatory Visit | Attending: Family Medicine | Admitting: Family Medicine

## 2016-06-11 VITALS — BP 100/54 | HR 75 | Temp 98.3°F | Ht 63.5 in | Wt 196.0 lb

## 2016-06-11 DIAGNOSIS — Z0000 Encounter for general adult medical examination without abnormal findings: Secondary | ICD-10-CM

## 2016-06-11 DIAGNOSIS — Z01419 Encounter for gynecological examination (general) (routine) without abnormal findings: Secondary | ICD-10-CM

## 2016-06-11 DIAGNOSIS — N912 Amenorrhea, unspecified: Secondary | ICD-10-CM

## 2016-06-11 LAB — POCT URINE PREGNANCY: Preg Test, Ur: NEGATIVE

## 2016-06-11 NOTE — Patient Instructions (Addendum)
I think it's a good idea to get a flu shot in the  Labs today  Pap today  Stay on current pill for now  Lab and urine preg test today  Stop at check out for referral for pelvic ultrasound  Take care of yourself- diet and exercise

## 2016-06-11 NOTE — Progress Notes (Signed)
Pre visit review using our clinic review tool, if applicable. No additional management support is needed unless otherwise documented below in the visit note. 

## 2016-06-11 NOTE — Progress Notes (Signed)
Subjective:    Patient ID: Hannah Walton, female    DOB: 05/02/1981, 35 y.o.   MRN: [id number]  HPI Here for health maintenance exam and to review chronic medical problems    Doing ok  Taking care of kids  Busy summer/ went to the beach   Wt Readings from Last 3 Encounters:  06/11/16 196 lb (88.9 kg)  11/26/15 191 lb 12 oz (87 kg)  11/02/15 180 lb (81.6 kg)  not really taking care of herself  bmi is 34  Diet is not good/too much junk  No exercise - wants to start (walking at work however)  Flu shots-usually does not get them   Pap 7/16 Due for that  Menses - none at all -even with the OC , does occ cramp a little ( ? For bm occ)  Last menses was before started the pill  Before that she was regular Needs the pill for birth control   No menses on lo estrin Did have period on yasmin- but periods were painful  This is not uncommon  Does not miss pills Urine preg test is negative today   Tetanus shot 6/15  Patient Active Problem List   Diagnosis Date Noted  . Dysuria 11/26/2015  . Amenorrhea 11/26/2015  . Encounter for routine gynecological examination 05/16/2015  . Routine general medical examination at a health care facility 05/08/2015  . Cyst of left knee joint 10/27/2014  . Left leg pain 03/07/2014  . Chronic constipation 03/07/2014  . BACK PAIN, LOW 12/17/2006   Past Medical History:  Diagnosis Date  . Cyst of knee joint 10/2014   left  . Cyst of left knee joint 10/27/2014  . History of gastric ulcer   . Sinus headache   . Synovitis of knee 10/2014   left   Past Surgical History:  Procedure Laterality Date  . KNEE ARTHROSCOPY Left 10/27/2014   Procedure: LEFT KNEE SCOPE SYNOVECTOMY;  Surgeon: Eulas PostJoshua P Landau, MD;  Location: [facility];  Service: Orthopedics;  Laterality: Left;  Marland Kitchen. MASS EXCISION Left 10/27/2014   Procedure: LEFT PCL CYST EXCISION ;  Surgeon: Eulas PostJoshua P Landau, MD;  Location: [facility];  Service:  Orthopedics;  Laterality: Left;  . UPPER GI ENDOSCOPY     Social History  Substance Use Topics  . Smoking status: Never Smoker  . Smokeless tobacco: Never Used  . Alcohol use No   Family History  Problem Relation Age of Onset  . Diabetes Maternal Grandmother   . Arthritis Mother   . Arthritis Paternal Grandmother   . Stroke Father   . Stroke Paternal Uncle   . Diabetes Maternal Grandmother   . Diabetes Paternal Uncle    Allergies  Allergen Reactions  . Yasmin [Drospirenone-Ethinyl Estradiol] Nausea Only  . Asa [Aspirin] Other (See Comments)    DUE TO HX. OF GASTRIC ULCER  . Ibuprofen Other (See Comments)    DUE TO HX. OF GASTRIC ULCER   Current Outpatient Prescriptions on File Prior to Visit  Medication Sig Dispense Refill  . diazepam (VALIUM) 5 MG tablet Take 1 tablet (5 mg total) by mouth every 8 (eight) hours as needed for muscle spasms. 20 tablet 0  . norethindrone-ethinyl estradiol (MICROGESTIN,JUNEL,LOESTRIN) 1-20 MG-MCG tablet Take 1 tablet by mouth daily. 1 Package 11   No current facility-administered medications on file prior to visit.      Review of Systems Review of Systems  Constitutional: Negative for fever, appetite change, fatigue and  unexpected weight change.  Eyes: Negative for pain and visual disturbance.  Respiratory: Negative for cough and shortness of breath.   Cardiovascular: Negative for cp or palpitations    Gastrointestinal: Negative for nausea, diarrhea and constipation.  Genitourinary: Negative for urgency and frequency. pos for lack of menses on current OC Skin: Negative for pallor or rash   Neurological: Negative for weakness, light-headedness, numbness and headaches.  Hematological: Negative for adenopathy. Does not bruise/bleed easily.  Psychiatric/Behavioral: Negative for dysphoric mood. The patient is not nervous/anxious.         Objective:   Physical Exam  Constitutional: She appears well-developed and well-nourished. No distress.   obese and well appearing   HENT:  Head: Normocephalic and atraumatic.  Right Ear: External ear normal.  Left Ear: External ear normal.  Mouth/Throat: Oropharynx is clear and moist.  Eyes: Conjunctivae and EOM are normal. Pupils are equal, round, and reactive to light. No scleral icterus.  Neck: Normal range of motion. Neck supple. No JVD present. Carotid bruit is not present. No thyromegaly present.  Cardiovascular: Normal rate, regular rhythm, normal heart sounds and intact distal pulses.  Exam reveals no gallop.   Pulmonary/Chest: Effort normal and breath sounds normal. No respiratory distress. She has no wheezes. She exhibits no tenderness.  Abdominal: Soft. Bowel sounds are normal. She exhibits no distension, no abdominal bruit and no mass. There is no tenderness.  Genitourinary: No breast swelling, tenderness, discharge or bleeding.  Genitourinary Comments: Breast exam: No mass, nodules, thickening, tenderness, bulging, retraction, inflamation, nipple discharge or skin changes noted.  No axillary or clavicular LA.             Anus appears normal w/o hemorrhoids or masses     External genitalia : nl appearance and hair distribution/no lesions     Urethral meatus : nl size, no lesions or prolapse     Urethra: no masses, tenderness or scarring    Bladder : no masses or tenderness     Vagina: nl general appearance, no discharge or  Lesions, no significant cystocele  or rectocele     Cervix: no lesions/ discharge or friability    Uterus: nl size, contour, position, and mobility (not fixed) , non tender    Adnexa : no masses, tenderness, enlargement or nodularity        Musculoskeletal: Normal range of motion. She exhibits no edema or tenderness.  Lymphadenopathy:    She has no cervical adenopathy.  Neurological: She is alert. She has normal reflexes. No cranial nerve deficit. She exhibits normal muscle tone. Coordination normal.  Skin: Skin is warm and dry. No  rash noted. No erythema. No pallor.  Psychiatric: She has a normal mood and affect.  Pleasant and talkative           Assessment & Plan:   Problem List Items Addressed This Visit      Other   Routine general medical examination at a health care facility - Primary    Reviewed health habits including diet and exercise and skin cancer prevention Reviewed appropriate screening tests for age  Also reviewed health mt list, fam hx and immunization status , as well as social and family history   See HPI Gyn exam today Recommend flu shot this fall  Lab today  Enc wt loss and healthy habits       Relevant Orders   CBC with Differential/Platelet (Completed)   Comprehensive metabolic panel (Completed)   TSH (Completed)   Lipid panel (Completed)  Encounter for routine gynecological examination    Nl exam today Pap sent  In light of amenorrhea (neg urine preg) -check tsh and pelvic US (pt is obese)  Want to r/o endometrial hyperplasia        Relevant Orders   Cytology - PAP   Amenorrhea    In obese female on low dose OC  This is recurrent  Pelvic US ordered to image endometrium (r/o hyperplasia) On loestrin 1/20  Lab today incl tsh  Neg preg test       Relevant Orders   US Pelvis Complete   US Transvaginal Non-OB   POCT urine pregnancy (Completed)    Other Visit Diagnoses   None.

## 2016-06-12 LAB — COMPREHENSIVE METABOLIC PANEL
ALK PHOS: 42 U/L (ref 39–117)
ALT: 12 U/L (ref 0–35)
AST: 20 U/L (ref 0–37)
Albumin: 4.3 g/dL (ref 3.5–5.2)
BUN: 11 mg/dL (ref 6–23)
CO2: 26 meq/L (ref 19–32)
Calcium: 9 mg/dL (ref 8.4–10.5)
Chloride: 103 mEq/L (ref 96–112)
Creatinine, Ser: 0.81 mg/dL (ref 0.40–1.20)
GFR: 102.81 mL/min (ref >60.00)
GLUCOSE: 80 mg/dL (ref 70–99)
POTASSIUM: 4 meq/L (ref 3.5–5.1)
SODIUM: 137 meq/L (ref 135–145)
TOTAL PROTEIN: 7.5 g/dL (ref 6.0–8.3)
Total Bilirubin: 0.4 mg/dL (ref 0.2–1.2)

## 2016-06-12 LAB — CBC WITH DIFFERENTIAL/PLATELET
BASOS ABS: 0 10*3/uL (ref 0.0–0.1)
Basophils Relative: 0.4 % (ref 0.0–3.0)
Eosinophils Absolute: 0 10*3/uL (ref 0.0–0.7)
Eosinophils Relative: 0.7 % (ref 0.0–5.0)
HCT: 35.4 % — ABNORMAL LOW (ref 36.0–46.0)
Hemoglobin: 11.8 g/dL — ABNORMAL LOW (ref 12.0–15.0)
LYMPHS ABS: 2.9 10*3/uL (ref 0.7–4.0)
Lymphocytes Relative: 41 % (ref 12.0–46.0)
MCHC: 33.3 g/dL (ref 30.0–36.0)
MCV: 82.2 fl (ref 78.0–100.0)
MONO ABS: 0.5 10*3/uL (ref 0.1–1.0)
Monocytes Relative: 6.6 % (ref 3.0–12.0)
NEUTROS PCT: 51.3 % (ref 43.0–77.0)
Neutro Abs: 3.6 10*3/uL (ref 1.4–7.7)
PLATELETS: 264 10*3/uL (ref 150.0–400.0)
RBC: 4.31 Mil/uL (ref 3.87–5.11)
RDW: 13.5 % (ref 11.5–15.5)
WBC: 7.1 10*3/uL (ref 4.0–10.5)

## 2016-06-12 LAB — LIPID PANEL
CHOLESTEROL: 191 mg/dL (ref 0–200)
HDL: 86.8 mg/dL (ref >39.00)
LDL Cholesterol: 88 mg/dL (ref 0–99)
NonHDL: 103.99
TRIGLYCERIDES: 82 mg/dL (ref 0.0–149.0)
Total CHOL/HDL Ratio: 2
VLDL: 16.4 mg/dL (ref 0.0–40.0)

## 2016-06-12 LAB — TSH: TSH: 0.47 u[IU]/mL (ref 0.35–4.50)

## 2016-06-12 NOTE — Assessment & Plan Note (Signed)
Nl exam today Pap sent  In light of amenorrhea (neg urine preg) -check tsh and pelvic US (pt is obese)  Want to r/o endometrial hyperplasia

## 2016-06-12 NOTE — Assessment & Plan Note (Addendum)
In obese female on low dose OC  This is recurrent  Pelvic us ordered to image endometrium (r/o hyperplasia) On loestrin 1/20  Lab today incl tsh  Neg preg test

## 2016-06-12 NOTE — Assessment & Plan Note (Signed)
Reviewed health habits including diet and exercise and skin cancer prevention Reviewed appropriate screening tests for age  Also reviewed health mt list, fam hx and immunization status , as well as social and family history   See HPI Gyn exam today Recommend flu shot this fall  Lab today  Enc wt loss and healthy habits

## 2016-06-13 ENCOUNTER — Telehealth: Payer: Self-pay | Admitting: Family Medicine

## 2016-06-13 LAB — HM PAP SMEAR: HM PAP: NORMAL

## 2016-06-13 LAB — CYTOLOGY - PAP

## 2016-06-13 NOTE — Telephone Encounter (Signed)
Patient returned Shapale's call. °

## 2016-06-13 NOTE — Telephone Encounter (Signed)
Addressed through result notes  

## 2016-06-16 ENCOUNTER — Other Ambulatory Visit: Payer: BLUE CROSS/BLUE SHIELD

## 2016-06-17 ENCOUNTER — Other Ambulatory Visit: Payer: Self-pay | Admitting: *Deleted

## 2016-06-17 MED ORDER — FLUCONAZOLE 150 MG PO TABS: 150.0000 mg | ORAL_TABLET | Freq: Once | ORAL | 0 refills | Status: AC

## 2016-06-17 NOTE — Telephone Encounter (Signed)
-----   Message from Judy PimpleMarne A Tower, MD sent at 06/14/2016  1:14 PM EDT ----- Pap is normal  Please adv pt  Note nl pap on health mt sheet  There was a yeast infection  Please call in diflucan 150 mg 1 po times one #1 no refill

## 2016-06-17 NOTE — Telephone Encounter (Signed)
Pt returned your call Best number [phone]

## 2016-06-17 NOTE — Telephone Encounter (Signed)
Left voicemail requesting pt to call the office back 

## 2016-06-17 NOTE — Telephone Encounter (Signed)
Pt notified of pap smear results and Dr. Tower's comments. Rx sent to pharmacy  

## 2016-07-22 ENCOUNTER — Telehealth: Payer: Self-pay | Admitting: Family Medicine

## 2016-07-22 NOTE — Telephone Encounter (Signed)
Patient Name: [name]  DOB: 03/28/1981    Initial Comment Caller states she is having urinary frequency.   Nurse Assessment  Nurse: Annye Englisharmon, RN, Angelique Blonderenise Date/Time (Eastern Time): 07/22/2016 1:11:20 PM  Confirm and document reason for call. If symptomatic, describe symptoms. You must click the next button to save text entered. ---Caller states she is having urinary frequency, burning/pain.  Has the patient traveled out of the country within the last 30 days? ---Not Applicable  Does the patient have any new or worsening symptoms? ---Yes  Will a triage be completed? ---Yes  Related visit to physician within the last 2 weeks? ---No  Does the PT have any chronic conditions? (i.e. diabetes, asthma, etc.) ---No  Is the patient pregnant or possibly pregnant? (Ask all females between the ages of 8812-55) ---No  Is this a behavioral health or substance abuse call? ---No     Guidelines    Guideline Title Affirmed Question Affirmed Notes  Urination Pain - Female All other patients with painful urination (Exception: [1] EITHER frequency or urgency AND [2] has on-call doctor)    Final Disposition User   See Physician within 24 Hours Carmon, RN, Angelique BlonderDenise    Comments  Appt made for 07/23/16 at 0815 with Dr Tillman Abideichard Letvak. Pt aware/agreeable.   Referrals  REFERRED TO PCP OFFICE   Disagree/Comply: Comply

## 2016-07-22 NOTE — Telephone Encounter (Signed)
Aware-do schedule something if her symptoms persist- ? Perhaps she could do an e visit if it is straight forward

## 2016-07-22 NOTE — Telephone Encounter (Signed)
Per Melissa at front desk pts husband called and cancelled appt with Dr Alphonsus SiasLetvak on 07/23/16 at 8:15 and will cb for appt.  I spoke with Hannah Walton's husband and pt cannot come in on 07/23/16 and will cb for appt when able.(did not want to schedule another appt now.) FYI to Dr Milinda Antisower.

## 2016-07-23 ENCOUNTER — Ambulatory Visit: Payer: Self-pay | Admitting: Internal Medicine

## 2016-09-03 ENCOUNTER — Other Ambulatory Visit: Payer: Self-pay | Admitting: Family Medicine

## 2016-10-06 ENCOUNTER — Ambulatory Visit (INDEPENDENT_AMBULATORY_CARE_PROVIDER_SITE_OTHER): Payer: BLUE CROSS/BLUE SHIELD | Admitting: Family Medicine

## 2016-10-06 ENCOUNTER — Encounter: Payer: Self-pay | Admitting: Family Medicine

## 2016-10-06 VITALS — BP 124/72 | HR 82 | Temp 98.7°F | Ht 63.25 in | Wt 198.5 lb

## 2016-10-06 DIAGNOSIS — B9789 Other viral agents as the cause of diseases classified elsewhere: Secondary | ICD-10-CM | POA: Diagnosis not present

## 2016-10-06 DIAGNOSIS — J069 Acute upper respiratory infection, unspecified: Secondary | ICD-10-CM | POA: Insufficient documentation

## 2016-10-06 MED ORDER — BENZONATATE 200 MG PO CAPS: 200.0000 mg | ORAL_CAPSULE | Freq: Three times a day (TID) | ORAL | 0 refills | Status: DC | PRN

## 2016-10-06 NOTE — Patient Instructions (Signed)
You have a head and chest cold with laryngitis  Drink lots of water  Get extra sleep when you can  Try the tessalon pills for cough  Use flonase daily  For night time -you can try 12  Hour afrin over the counter for 3 days and then stop it (it can be habit forming)   mucinex is ok   Rest your voice as much as you can   Update if not starting to improve in a week or if worsening

## 2016-10-06 NOTE — Progress Notes (Signed)
Subjective:    Patient ID: Hannah Walton, female    DOB: 12/30/1980, 35 y.o.   MRN: [id number]  HPI Here for uri symptoms since Thursday  ST Then lost voice  Very congested/nose- mucous is yellow  Cough- brown to yellow sputum   ST is improved- until she tries to breathe at night   No fever   Took mucinex D and thera flu otc    Patient Active Problem List   Diagnosis Date Noted  . Viral URI with cough 10/06/2016  . Dysuria 11/26/2015  . Amenorrhea 11/26/2015  . Encounter for routine gynecological examination 05/16/2015  . Routine general medical examination at a health care facility 05/08/2015  . Cyst of left knee joint 10/27/2014  . Left leg pain 03/07/2014  . Chronic constipation 03/07/2014  . BACK PAIN, LOW 12/17/2006   Past Medical History:  Diagnosis Date  . Cyst of knee joint 10/2014   left  . Cyst of left knee joint 10/27/2014  . History of gastric ulcer   . Sinus headache   . Synovitis of knee 10/2014   left   Past Surgical History:  Procedure Laterality Date  . KNEE ARTHROSCOPY Left 10/27/2014   Procedure: LEFT KNEE SCOPE SYNOVECTOMY;  Surgeon: Eulas PostJoshua P Landau, MD;  Location: [facility];  Service: Orthopedics;  Laterality: Left;  Marland Kitchen. MASS EXCISION Left 10/27/2014   Procedure: LEFT PCL CYST EXCISION ;  Surgeon: Eulas PostJoshua P Landau, MD;  [facility];  Service: Orthopedics;  Laterality: Left;  . UPPER GI ENDOSCOPY     Social History  Substance Use Topics  . Smoking status: Never Smoker  . Smokeless tobacco: Never Used  . Alcohol use No   Family History  Problem Relation Age of Onset  . Diabetes Maternal Grandmother   . Arthritis Mother   . Arthritis Paternal Grandmother   . Stroke Father   . Stroke Paternal Uncle   . Diabetes Maternal Grandmother   . Diabetes Paternal Uncle    Allergies  Allergen Reactions  . Yasmin [Drospirenone-Ethinyl Estradiol] Nausea Only  . Asa [Aspirin] Other (See Comments)    DUE TO  HX. OF GASTRIC ULCER  . Ibuprofen Other (See Comments)    DUE TO HX. OF GASTRIC ULCER   Current Outpatient Prescriptions on File Prior to Visit  Medication Sig Dispense Refill  . diazepam (VALIUM) 5 MG tablet Take 1 tablet (5 mg total) by mouth every 8 (eight) hours as needed for muscle spasms. 20 tablet 0  . JUNEL 1/20 1-20 MG-MCG tablet TAKE 1 TABLET BY MOUTH DAILY. 21 tablet 6   No current facility-administered medications on file prior to visit.     Review of Systems  Constitutional: Positive for appetite change and fatigue. Negative for fever.  HENT: Positive for congestion, postnasal drip, rhinorrhea, sinus pressure, sneezing and sore throat. Negative for ear pain.   Eyes: Negative for pain and discharge.  Respiratory: Positive for cough. Negative for shortness of breath, wheezing and stridor.   Cardiovascular: Negative for chest pain.  Gastrointestinal: Negative for diarrhea, nausea and vomiting.  Genitourinary: Negative for frequency, hematuria and urgency.  Musculoskeletal: Negative for arthralgias and myalgias.  Skin: Negative for rash.  Neurological: Positive for headaches. Negative for dizziness, weakness and light-headedness.  Psychiatric/Behavioral: Negative for confusion and dysphoric mood.       Objective:   Physical Exam  Constitutional: She appears well-developed and well-nourished. No distress.  Well appearing/ congested   HENT:  Head: Normocephalic  and atraumatic.  Right Ear: External ear normal.  Left Ear: External ear normal.  Mouth/Throat: Oropharynx is clear and moist.  Nares are injected and congested  No sinus tenderness Clear rhinorrhea and post nasal drip   Hoarse voice   Eyes: Conjunctivae and EOM are normal. Pupils are equal, round, and reactive to light. Right eye exhibits no discharge. Left eye exhibits no discharge.  Neck: Normal range of motion. Neck supple.  Cardiovascular: Normal rate and normal heart sounds.   Pulmonary/Chest: Effort  normal and breath sounds normal. No respiratory distress. She has no wheezes. She has no rales. She exhibits no tenderness.  Harsh bs No rales or rhonchi  Lymphadenopathy:    She has no cervical adenopathy.  Neurological: She is alert.  Skin: Skin is warm and dry. No rash noted. No pallor.  Psychiatric: She has a normal mood and affect.          Assessment & Plan:   Problem List Items Addressed This Visit      Respiratory   Viral URI with cough    Disc symptomatic care - see instructions on AVS  Re assuring exam Voice rest for laryngitis mucinex  Afrin ok for 3 d  flonase Tessalon for cough prn  Update if not starting to improve in a week or if worsening   Handout given on uri

## 2016-10-06 NOTE — Progress Notes (Signed)
Pre visit review using our clinic review tool, if applicable. No additional management support is needed unless otherwise documented below in the visit note. 

## 2016-10-06 NOTE — Assessment & Plan Note (Signed)
Disc symptomatic care - see instructions on AVS  Re assuring exam Voice rest for laryngitis mucinex  Afrin ok for 3 d  flonase Tessalon for cough prn  Update if not starting to improve in a week or if worsening   Handout given on uri

## 2016-11-05 ENCOUNTER — Ambulatory Visit: Payer: BLUE CROSS/BLUE SHIELD | Admitting: Family Medicine

## 2017-01-29 ENCOUNTER — Other Ambulatory Visit: Payer: Self-pay | Admitting: Family Medicine

## 2017-05-11 ENCOUNTER — Ambulatory Visit (INDEPENDENT_AMBULATORY_CARE_PROVIDER_SITE_OTHER): Payer: BLUE CROSS/BLUE SHIELD | Admitting: Family Medicine

## 2017-05-11 ENCOUNTER — Telehealth: Payer: Self-pay | Admitting: Family Medicine

## 2017-05-11 ENCOUNTER — Encounter: Payer: Self-pay | Admitting: Family Medicine

## 2017-05-11 ENCOUNTER — Ambulatory Visit (INDEPENDENT_AMBULATORY_CARE_PROVIDER_SITE_OTHER): Payer: BLUE CROSS/BLUE SHIELD

## 2017-05-11 VITALS — BP 120/80 | HR 67 | Temp 99.0°F | Wt 197.6 lb

## 2017-05-11 DIAGNOSIS — M50321 Other cervical disc degeneration at C4-C5 level: Secondary | ICD-10-CM | POA: Diagnosis not present

## 2017-05-11 DIAGNOSIS — R202 Paresthesia of skin: Secondary | ICD-10-CM | POA: Insufficient documentation

## 2017-05-11 LAB — POCT URINE PREGNANCY: Preg Test, Ur: NEGATIVE

## 2017-05-11 MED ORDER — CYCLOBENZAPRINE HCL 10 MG PO TABS: 10.0000 mg | ORAL_TABLET | Freq: Three times a day (TID) | ORAL | 0 refills | Status: DC | PRN

## 2017-05-11 MED ORDER — PREDNISONE 10 MG PO TABS: ORAL_TABLET | ORAL | 0 refills | Status: DC

## 2017-05-11 NOTE — Telephone Encounter (Signed)
Patient Name: Hannah Walton  DOB: 04/25/1981    Initial Comment Caller states she is having a lot of pain in left side from her neck radiating down below her shoulders. Hx of muscle spasms.    Nurse Assessment  Nurse: Stefano GaulStringer, RN, Dwana CurdVera Date/Time (Eastern Time): 05/11/2017 9:02:54 AM  Confirm and document reason for call. If symptomatic, describe symptoms. ---Caller states she is having a lot of pain on the left side of her neck which radiates to beneath her shoulder. Has had intermittent pain for a month. Had pain 2 weeks ago and it went away. Came back yesterday. pain level 10. No fever.  Does the patient have any new or worsening symptoms? ---Yes  Will a triage be completed? ---Yes  Related visit to physician within the last 2 weeks? ---No  Does the PT have any chronic conditions? (i.e. diabetes, asthma, etc.) ---No  Is the patient pregnant or possibly pregnant? (Ask all females between the ages of 7712-55) ---No  Is this a behavioral health or substance abuse call? ---No     Guidelines    Guideline Title Affirmed Question Affirmed Notes  Neck Pain or Stiffness [1] SEVERE neck pain (e.g., excruciating, unable to do any normal activities) AND [2] not improved after 2 hours of pain medicine    Final Disposition User   See Physician within 4 Hours (or PCP triage) Stefano GaulStringer, RN, Vera    Comments  called primary number and left message. Will try secondary number.  [facility]. [facility] Marikay AlarEric Sonnenberg   Referrals  REFERRED TO PCP OFFICE   Disagree/Comply: Comply

## 2017-05-11 NOTE — Patient Instructions (Signed)
Nice to see you. We'll call you with your x-ray result. We'll treat you with a prednisone taper and muscle relaxer called Flexeril. This may make you drowsy so be careful while taking this. If you develop weakness, worsening pain, numbness elsewhere, or any new or changing symptoms please seek medical attention.

## 2017-05-11 NOTE — Assessment & Plan Note (Addendum)
Patient with trapezius spasm and paresthesia down her left radial aspect arm that has been going on intermittently for a couple of months. She has positive Spurling's on the left. Positive spasm as well in the left trapezius. Suspect nerve impingement. Sensation of needing to take deep breaths yesterday potentially could've been related to her pain. Her vital signs are stable. She hasn't had any chest pain. She has no risk factors for cardiac issues. Benign lung exam. Wells score of 0 making VTE unlikely. Discussed monitoring for recurrence and being reevaluated if she has recurrence. We'll obtain an x-ray of her neck. Urine pregnancy test was performed prior to this. We will treat with a steroid taper. We'll treat with Flexeril as well. If x-ray has abnormalities we'll obtain an MRI. If no abnormalities on x-ray she will proceed with treatment and if not improving obtain an MRI and potentially refer to orthopedics. Given return precautions.

## 2017-05-11 NOTE — Progress Notes (Signed)
Marikay Alar, MD Phone: [phone]  Hannah Walton is a 36 y.o. female who presents today for same-day visit.  Patient presents for left trapezius discomfort. Notes over the last several days this has been bothering her. It has been going on intermittently over the last 2 months. Notes the midportion of her trapezius bothers her and will shoot down her left arm in the radial aspect of the arm. Notes the pain makes it difficult to carry her purse though no weakness. Does feel numb in the radial aspect of her left arm to her mid forearm. She notes no injuries. Has taken Aleve in the past with some benefit. Has taken Advil over the last several days with little benefit. She notes no numbness or weakness anywhere else in her body. She notes no chest pain. Did note feeling the need to take deep breaths yesterday after going up the stairs. No cough. No unilateral swelling or bilateral swelling. No DVT history. No fever. Has not recurred and she does not have any issues today with prior. No family history of cardiac disease. No diabetes, hypertension, or hyperlipidemia.  PMH: nonsmoker.   ROS see history of present illness  Objective  Physical Exam Vitals:   05/11/17 1310  BP: 120/80  Pulse: 67  Temp: 99 F (37.2 C)    BP Readings from Last 3 Encounters:  05/11/17 120/80  10/06/16 124/72  06/11/16 (!) 100/54   Wt Readings from Last 3 Encounters:  05/11/17 197 lb 9.6 oz (89.6 kg)  10/06/16 198 lb 8 oz (90 kg)  06/11/16 196 lb (88.9 kg)    Physical Exam  Constitutional: No distress.  Cardiovascular: Normal rate, regular rhythm and normal heart sounds.   Pulmonary/Chest: Effort normal and breath sounds normal.  Musculoskeletal:  No midline spine step-off, no muscular neck or back tenderness, there is tenderness in the midportion of her left trapezius with spasm noted, positive Spurling's on the left, negative Spurling's on the right, slight decreased rotational range of  motion due to discomfort on the left  Neurological: She is alert.  5/5 strength in bilateral biceps, triceps, grip, quads, hamstrings, plantar and dorsiflexion, sensation to light touch intact in bilateral UE and LE, normal gait, 2+ patellar reflexes  Skin: Skin is warm and dry. She is not diaphoretic.     Assessment/Plan: Please see individual problem list.  Paresthesia of left arm Patient with trapezius spasm and paresthesia down her left radial aspect arm that has been going on intermittently for a couple of months. She has positive Spurling's on the left. Positive spasm as well in the left trapezius. Suspect nerve impingement. Sensation of needing to take deep breaths yesterday potentially could've been related to her pain. Her vital signs are stable. She hasn't had any chest pain. She has no risk factors for cardiac issues. Benign lung exam. Wells score of 0 making VTE unlikely. Discussed monitoring for recurrence and being reevaluated if she has recurrence. We'll obtain an x-ray of her neck. Urine pregnancy test was performed prior to this. We will treat with a steroid taper. We'll treat with Flexeril as well. If x-ray has abnormalities we'll obtain an MRI. If no abnormalities on x-ray she will proceed with treatment and if not improving obtain an MRI and potentially refer to orthopedics. Given return precautions.   Orders Placed This Encounter  Procedures  . DG Cervical Spine Complete    Standing Status:   Future    Number of Occurrences:   1  Standing Expiration Date:   07/12/2018    Order Specific Question:   Reason for Exam (SYMPTOM  OR DIAGNOSIS REQUIRED)    Answer:   left trapezius pain radiating down left radial aspect arm    Order Specific Question:   Is patient pregnant?    Answer:   No    Order Specific Question:   Preferred imaging location?    Answer:   [facility]    Order Specific Question:   Radiology Contrast Protocol - do NOT remove file path     Answer:   [email or web address]  . POCT urine pregnancy    Meds ordered this encounter  Medications  . cyclobenzaprine (FLEXERIL) 10 MG tablet    Sig: Take 1 tablet (10 mg total) by mouth 3 (three) times daily as needed for muscle spasms.    Dispense:  30 tablet    Refill:  0  . predniSONE (DELTASONE) 10 MG tablet    Sig: Take 50 mg by mouth today, then decrease by 1 tablet daily until gone    Dispense:  15 tablet    Refill:  0   Marikay AlarEric Jaskarn Schweer, MD [facility]

## 2017-05-12 ENCOUNTER — Telehealth: Payer: Self-pay | Admitting: Family Medicine

## 2017-05-12 NOTE — Telephone Encounter (Signed)
See result note message. Dr. Sonnenberg pt.  

## 2017-05-12 NOTE — Telephone Encounter (Signed)
Pt called back returning your call. Please advise, thank you!  Call pt @ [phone]

## 2017-05-13 ENCOUNTER — Telehealth: Payer: Self-pay | Admitting: Family Medicine

## 2017-05-13 NOTE — Telephone Encounter (Signed)
Pt called back wanting to know if there was another medication other than the ones that was prescribed or do the MRI? Please advise?  Call pt @ [phone]. Thank you!

## 2017-05-13 NOTE — Telephone Encounter (Signed)
Please advise 

## 2017-05-13 NOTE — Telephone Encounter (Signed)
If symptoms are not improving over the next day or so I would suggest obtaining the MRI.

## 2017-05-15 NOTE — Telephone Encounter (Signed)
Left message for patient to return call to office. 

## 2017-05-15 NOTE — Telephone Encounter (Signed)
Noted  

## 2017-05-15 NOTE — Telephone Encounter (Signed)
Patient stated she takes the last of her medication today and things are improving she would like to give things more time to see if symptoms reside before doing and MRI.

## 2017-06-03 ENCOUNTER — Other Ambulatory Visit: Payer: Self-pay | Admitting: Family Medicine

## 2017-06-12 ENCOUNTER — Encounter: Payer: Self-pay | Admitting: Family Medicine

## 2017-06-12 ENCOUNTER — Ambulatory Visit (INDEPENDENT_AMBULATORY_CARE_PROVIDER_SITE_OTHER): Payer: BLUE CROSS/BLUE SHIELD | Admitting: Family Medicine

## 2017-06-12 ENCOUNTER — Other Ambulatory Visit (HOSPITAL_COMMUNITY)
Admission: RE | Admit: 2017-06-12 | Discharge: 2017-06-12 | Disposition: A | Discharge status: Home / Self Care | Payer: BLUE CROSS/BLUE SHIELD | Source: Ambulatory Visit | Attending: Family Medicine | Admitting: Family Medicine

## 2017-06-12 VITALS — BP 100/62 | HR 82 | Temp 98.1°F | Resp 16 | Ht 63.0 in | Wt 195.8 lb

## 2017-06-12 DIAGNOSIS — Z0000 Encounter for general adult medical examination without abnormal findings: Secondary | ICD-10-CM | POA: Diagnosis not present

## 2017-06-12 DIAGNOSIS — Z01419 Encounter for gynecological examination (general) (routine) without abnormal findings: Secondary | ICD-10-CM | POA: Insufficient documentation

## 2017-06-12 DIAGNOSIS — N912 Amenorrhea, unspecified: Secondary | ICD-10-CM

## 2017-06-12 DIAGNOSIS — R202 Paresthesia of skin: Secondary | ICD-10-CM

## 2017-06-12 LAB — CBC WITH DIFFERENTIAL/PLATELET
BASOS ABS: 0 10*3/uL (ref 0.0–0.1)
Basophils Relative: 0.3 % (ref 0.0–3.0)
EOS PCT: 0.5 % (ref 0.0–5.0)
Eosinophils Absolute: 0 10*3/uL (ref 0.0–0.7)
HEMATOCRIT: 39 % (ref 36.0–46.0)
HEMOGLOBIN: 12.7 g/dL (ref 12.0–15.0)
LYMPHS ABS: 2.3 10*3/uL (ref 0.7–4.0)
LYMPHS PCT: 41.7 % (ref 12.0–46.0)
MCHC: 32.5 g/dL (ref 30.0–36.0)
MCV: 83.5 fl (ref 78.0–100.0)
MONOS PCT: 12.9 % — AB (ref 3.0–12.0)
Monocytes Absolute: 0.7 10*3/uL (ref 0.1–1.0)
NEUTROS PCT: 44.6 % (ref 43.0–77.0)
Neutro Abs: 2.4 10*3/uL (ref 1.4–7.7)
Platelets: 310 10*3/uL (ref 150.0–400.0)
RBC: 4.67 Mil/uL (ref 3.87–5.11)
RDW: 13.3 % (ref 11.5–15.5)
WBC: 5.4 10*3/uL (ref 4.0–10.5)

## 2017-06-12 LAB — COMPREHENSIVE METABOLIC PANEL
ALBUMIN: 4.1 g/dL (ref 3.5–5.2)
ALK PHOS: 55 U/L (ref 39–117)
ALT: 11 U/L (ref 0–35)
AST: 18 U/L (ref 0–37)
BILIRUBIN TOTAL: 0.3 mg/dL (ref 0.2–1.2)
BUN: 12 mg/dL (ref 6–23)
CO2: 28 mEq/L (ref 19–32)
Calcium: 9.5 mg/dL (ref 8.4–10.5)
Chloride: 104 mEq/L (ref 96–112)
Creatinine, Ser: 0.84 mg/dL (ref 0.40–1.20)
GFR: 98.04 mL/min (ref >60.00)
GLUCOSE: 99 mg/dL (ref 70–99)
Potassium: 4.5 mEq/L (ref 3.5–5.1)
Sodium: 138 mEq/L (ref 135–145)
TOTAL PROTEIN: 7.8 g/dL (ref 6.0–8.3)

## 2017-06-12 LAB — LIPID PANEL
CHOLESTEROL: 200 mg/dL (ref 0–200)
HDL: 83.6 mg/dL (ref >39.00)
LDL Cholesterol: 102 mg/dL — ABNORMAL HIGH (ref 0–99)
NONHDL: 115.98
Total CHOL/HDL Ratio: 2
Triglycerides: 72 mg/dL (ref 0.0–149.0)
VLDL: 14.4 mg/dL (ref 0.0–40.0)

## 2017-06-12 LAB — TSH: TSH: 0.84 u[IU]/mL (ref 0.35–4.50)

## 2017-06-12 MED ORDER — CYCLOBENZAPRINE HCL 10 MG PO TABS: 10.0000 mg | ORAL_TABLET | Freq: Three times a day (TID) | ORAL | 1 refills | Status: DC | PRN

## 2017-06-12 MED ORDER — NORETHINDRONE ACET-ETHINYL EST 1-20 MG-MCG PO TABS: 1.0000 | ORAL_TABLET | Freq: Every day | ORAL | 11 refills | Status: DC

## 2017-06-12 NOTE — Assessment & Plan Note (Signed)
Ongoing in obese female on OC  Likely not a problem but ref for pelvic US to look at size of endometrial lining  (note ref last year and she cancelled it)  Enc her to go this year   No symptoms or menopause except c/o vaginal dryness

## 2017-06-12 NOTE — Assessment & Plan Note (Signed)
Reviewed health habits including diet and exercise and skin cancer prevention Reviewed appropriate screening tests for age  Also reviewed health mt list, fam hx and immunization status , as well as social and family history   See HPI  Labs today  Enc healthy diet and exercise and continued work on wt loss

## 2017-06-12 NOTE — Assessment & Plan Note (Signed)
This is improved but she still feels spasms around scapula  Has flexeril prn- refilled Rev CS films  Consider MRI if this does not resolve in the future

## 2017-06-12 NOTE — Progress Notes (Signed)
Subjective:    Patient ID: Hannah Walton, female    DOB: 05/24/1981, 36 y.o.   MRN: [id number]  HPI Here for health maintenance exam and to review chronic medical problems    Doing well overall  Working a lot - summer is going fast    Wt Readings from Last 3 Encounters:  06/12/17 195 lb 12.8 oz (88.8 kg)  05/11/17 197 lb 9.6 oz (89.6 kg)  10/06/16 198 lb 8 oz (90 kg)  she is working on weight loss  More walking (tracks her steps)  Drinks a lot of water  Avoiding snacking  34.68 kg/m  Had Tdap 6/15  She declines flu shot   Pap 8/17-nl (yeast)  Still on junel 1/20 -still no periods  She cancelled her [place] last year for lack of menses Does not desire STD screening  She tends to have burning during intercourse  No pelvic pain (used to have it)   Tends to cramp before BMs  Less constipated than she used to be    Soaps in epsom salts     Having spasms in back - saw Dr Antony Haste No longer has numb feeling- just some spasms on shoulder blade area    Due for wellness labs    Patient Active Problem List   Diagnosis Date Noted  . Paresthesia of left arm 05/11/2017  . Amenorrhea 11/26/2015  . Encounter for routine gynecological examination 05/16/2015  . Routine general medical examination at a health care facility 05/08/2015  . Chronic constipation 03/07/2014   Past Medical History:  Diagnosis Date  . Cyst of knee joint 10/2014   left  . Cyst of left knee joint 10/27/2014  . History of gastric ulcer   . Sinus headache   . Synovitis of knee 10/2014   left   Past Surgical History:  Procedure Laterality Date  . KNEE ARTHROSCOPY Left 10/27/2014   Procedure: LEFT KNEE SCOPE SYNOVECTOMY;  Surgeon: Eulas Post, MD;  Location: [facility];  Service: Orthopedics;  Laterality: Left;  Marland Kitchen MASS EXCISION Left 10/27/2014   Procedure: LEFT PCL CYST EXCISION ;  Surgeon: Eulas Post, MD;  Location: [facility];  Service: Orthopedics;   Laterality: Left;  . UPPER GI ENDOSCOPY     Social History  Substance Use Topics  . Smoking status: Never Smoker  . Smokeless tobacco: Never Used  . Alcohol use No   Family History  Problem Relation Age of Onset  . Arthritis Mother   . Diabetes Maternal Grandmother   . Arthritis Paternal Grandmother   . Stroke Father   . Stroke Paternal Uncle   . Diabetes Paternal Uncle    Allergies  Allergen Reactions  . Yasmin [Drospirenone-Ethinyl Estradiol] Nausea Only  . Asa [Aspirin] Other (See Comments)    DUE TO HX. OF GASTRIC ULCER  . Ibuprofen Other (See Comments)    DUE TO HX. OF GASTRIC ULCER   No current outpatient prescriptions on file prior to visit.   No current facility-administered medications on file prior to visit.     Review of Systems Review of Systems  Constitutional: Negative for fever, appetite change, fatigue and unexpected weight change.  Eyes: Negative for pain and visual disturbance.  Respiratory: Negative for cough and shortness of breath.   Cardiovascular: Negative for cp or palpitations    Gastrointestinal: Negative for nausea, diarrhea and constipation. pos for occ cramping  Genitourinary: Negative for urgency and frequency. pos for lack of menses (2 recent  preg tests neg)  Skin: Negative for pallor or rash   Neurological: Negative for weakness, light-headedness, numbness and headaches.  Hematological: Negative for adenopathy. Does not bruise/bleed easily.  Psychiatric/Behavioral: Negative for dysphoric mood. The patient is not nervous/anxious.         Objective:   Physical Exam  Constitutional: She appears well-developed and well-nourished. No distress.  obese and well appearing   HENT:  Head: Normocephalic and atraumatic.  Right Ear: External ear normal.  Left Ear: External ear normal.  Mouth/Throat: Oropharynx is clear and moist.  Eyes: Pupils are equal, round, and reactive to light. Conjunctivae and EOM are normal. No scleral icterus.  Neck:  Normal range of motion. Neck supple. No JVD present. Carotid bruit is not present. No thyromegaly present.  Cardiovascular: Normal rate, regular rhythm, normal heart sounds and intact distal pulses.  Exam reveals no gallop.   Pulmonary/Chest: Effort normal and breath sounds normal. No respiratory distress. She has no wheezes. She exhibits no tenderness.  Abdominal: Soft. Bowel sounds are normal. She exhibits no distension, no abdominal bruit and no mass. There is no tenderness.  Genitourinary: No breast swelling, tenderness, discharge or bleeding.  Genitourinary Comments: Breast exam: No mass, nodules, thickening, tenderness, bulging, retraction, inflamation, nipple discharge or skin changes noted.  No axillary or clavicular LA.              Anus appears normal w/o hemorrhoids or masses     External genitalia : nl appearance and hair distribution/no lesions     Urethral meatus : nl size, no lesions or prolapse     Urethra: no masses, tenderness or scarring    Bladder : no masses or tenderness     Vagina: nl general appearance, no discharge or  Lesions, no significant cystocele  or rectocele     Cervix: no lesions/ discharge or friability    Uterus: nl size, contour, position, and mobility (not fixed) , non tender    Adnexa : no masses, tenderness, enlargement or nodularity      (pt did not have any discomfort on exam)   Musculoskeletal: Normal range of motion. She exhibits no edema or tenderness.  Lymphadenopathy:    She has no cervical adenopathy.  Neurological: She is alert. She has normal reflexes. No cranial nerve deficit. She exhibits normal muscle tone. Coordination normal.  Skin: Skin is warm and dry. No rash noted. No erythema. No pallor.  Psychiatric: She has a normal mood and affect.          Assessment & Plan:   Problem List Items Addressed This Visit      Other   Amenorrhea    Ongoing in obese female on OC  Likely not a problem but ref for  pelvic US to look at size of endometrial lining  (note ref last year and she cancelled it)  Enc her to go this year   No symptoms or menopause except c/o vaginal dryness      Relevant Orders   US Pelvis Complete   US Transvaginal Non-OB   Encounter for routine gynecological examination    Routine exam with pap  Suspect some vaginal dryness causes burning with intercourse   (re assuring exam)  Enc her to use KY or other lubricant  Pap result pending  Has amenorrhea on OC - ref again for pelvic US and enc her to go      Relevant Orders   Cytology - PAP   Paresthesia of left arm  This is improved but she still feels spasms around scapula  Has flexeril prn- refilled Rev CS films  Consider MRI if this does not resolve in the future       Routine general medical examination at a health care facility - Primary    Reviewed health habits including diet and exercise and skin cancer prevention Reviewed appropriate screening tests for age  Also reviewed health mt list, fam hx and immunization status , as well as social and family history   See HPI  Labs today  Enc healthy diet and exercise and continued work on wt loss       Relevant Orders   CBC with Differential/Platelet   Comprehensive metabolic panel   Lipid panel   TSH

## 2017-06-12 NOTE — Assessment & Plan Note (Signed)
Routine exam with pap  Suspect some vaginal dryness causes burning with intercourse   (re assuring exam)  Enc her to use KY or other lubricant  Pap result pending  Has amenorrhea on OC - ref again for pelvic US and enc her to go

## 2017-06-12 NOTE — Patient Instructions (Addendum)
Try some heat on your back when it hurts (10 min at a time)  If numbness or tingling return please let me know Keep walking  Keep watching your diet   We will refer you for an ultrasound to looks at endometrium to make sure it is stable  Continue the oral contraceptive   Labs today

## 2017-06-15 ENCOUNTER — Encounter: Payer: Self-pay | Admitting: *Deleted

## 2017-06-17 LAB — CYTOLOGY - PAP: DIAGNOSIS: NEGATIVE

## 2017-06-19 ENCOUNTER — Telehealth: Payer: Self-pay | Admitting: *Deleted

## 2017-06-19 MED ORDER — FLUCONAZOLE 150 MG PO TABS: 150.0000 mg | ORAL_TABLET | Freq: Once | ORAL | 0 refills | Status: AC

## 2017-06-19 NOTE — Telephone Encounter (Signed)
-----   Message from Judy PimpleMarne A Tower, MD sent at 06/18/2017  3:37 PM EDT ----- Negative pap but results indicate she has a yeast infection Please call/send in diflucan 150 mg 1 po times one #1 no ref  Alert us if any problems

## 2017-06-19 NOTE — Telephone Encounter (Signed)
Pt notified of pap smear results and Dr. Royden Purlower's comments. Rx sent to pharmacy

## 2017-06-23 ENCOUNTER — Ambulatory Visit
Admission: RE | Admit: 2017-06-23 | Discharge: 2017-06-23 | Disposition: A | Discharge status: Home / Self Care | Payer: BLUE CROSS/BLUE SHIELD | Source: Ambulatory Visit | Attending: Family Medicine | Admitting: Family Medicine

## 2017-06-23 DIAGNOSIS — N912 Amenorrhea, unspecified: Secondary | ICD-10-CM | POA: Diagnosis not present

## 2017-06-25 ENCOUNTER — Telehealth: Payer: Self-pay | Admitting: Family Medicine

## 2017-06-25 NOTE — Telephone Encounter (Signed)
Pt returned your call and is requesting a cb °

## 2017-06-26 NOTE — Telephone Encounter (Signed)
Addressed through result notes  

## 2017-06-26 NOTE — Telephone Encounter (Signed)
Left voicemail requesting pt to call the office back 

## 2018-03-07 ENCOUNTER — Other Ambulatory Visit: Payer: Self-pay | Admitting: Family Medicine

## 2018-03-08 NOTE — Telephone Encounter (Signed)
Pt takes med for 21 days then off for 7 (period week), Dr. Milinda Antis before I fill med please see pt's original question regarding weight issue

## 2018-03-08 NOTE — Telephone Encounter (Signed)
It is a low dose pill - they generally do not cause weight gain - but cannot rule it out   yasmin or yaz would be options but she gets nausea from that progesterone   Would she consider an IUD? I can ref her to gyn for this   If not -can refill until her next physical

## 2018-03-08 NOTE — Telephone Encounter (Signed)
I spoke with Mellody Dance at [facility] and pt is out of refills; Mellody Dance wanted to clarify is pt supposed to take pills q 21 days or should pt take pills for 21 days and then off BC pills for 7 days. Please advise.

## 2018-03-08 NOTE — Telephone Encounter (Signed)
Please check in with the patient - unsure  Thanks

## 2018-03-08 NOTE — Telephone Encounter (Signed)
Pt called about birth control. She said she should have refills but the pharmacy stated there are no remaining refills. Pt also concerned that she's gained some weight possibly from birth control. Pt requesting call back at [phone].  [name], [place] - [address] [phone] (Phone) [phone] (Fax)

## 2018-03-09 ENCOUNTER — Telehealth: Payer: Self-pay | Admitting: Family Medicine

## 2018-03-09 NOTE — Telephone Encounter (Signed)
Copied from CRM [id number]. Topic: Quick Communication - See Telephone Encounter >> Mar 09, 2018  4:24 PM Shon Millet, CMA wrote: CRM for notification. See Telephone encounter regarding issues with birth control pills and weight gain. Please have pt talk to Triage nurse so they can relay Dr. Royden Purl comments  "It is a low dose pill - they generally do not cause weight gain - but cannot rule it out   yasmin or yaz would be options but she gets nausea from that progesterone   Would she consider an IUD? I can ref her to gyn for this   If not -can refill until her next physical"

## 2018-03-09 NOTE — Telephone Encounter (Signed)
Called pt and no answer and no VM box is full, CRM created

## 2018-03-10 MED ORDER — NORETHINDRONE ACET-ETHINYL EST 1-20 MG-MCG PO TABS: 1.0000 | ORAL_TABLET | Freq: Every day | ORAL | 11 refills | Status: DC

## 2018-03-10 NOTE — Telephone Encounter (Addendum)
Pt called and given information per Dr Milinda Antis, "It is a low dose pill- they generally do not cause weight gain- but can not rule it out; yasmin or yaz would be options byt she gets nausea from that progesterone; would she consider an IUD? I can refer her to gyn for this; if not -can refill until her next physical'; the pt verbalizes understanding and would like to continue on junel; will route to office for notification; the pt also states that [facility]; her best contact numrber is [phone].

## 2018-03-10 NOTE — Telephone Encounter (Signed)
Med filled.  

## 2018-06-17 ENCOUNTER — Other Ambulatory Visit (HOSPITAL_COMMUNITY)
Admission: RE | Admit: 2018-06-17 | Discharge: 2018-06-17 | Disposition: A | Discharge status: Home / Self Care | Payer: BLUE CROSS/BLUE SHIELD | Source: Ambulatory Visit | Attending: Family Medicine | Admitting: Family Medicine

## 2018-06-17 ENCOUNTER — Encounter: Payer: Self-pay | Admitting: Family Medicine

## 2018-06-17 ENCOUNTER — Ambulatory Visit (INDEPENDENT_AMBULATORY_CARE_PROVIDER_SITE_OTHER): Payer: BLUE CROSS/BLUE SHIELD | Admitting: Family Medicine

## 2018-06-17 ENCOUNTER — Encounter: Payer: Self-pay | Admitting: *Deleted

## 2018-06-17 VITALS — BP 124/82 | HR 70 | Temp 98.3°F | Ht 63.0 in | Wt 203.5 lb

## 2018-06-17 DIAGNOSIS — N912 Amenorrhea, unspecified: Secondary | ICD-10-CM | POA: Diagnosis not present

## 2018-06-17 DIAGNOSIS — R202 Paresthesia of skin: Secondary | ICD-10-CM | POA: Diagnosis not present

## 2018-06-17 DIAGNOSIS — Z1151 Encounter for screening for human papillomavirus (HPV): Secondary | ICD-10-CM | POA: Diagnosis not present

## 2018-06-17 DIAGNOSIS — Z0000 Encounter for general adult medical examination without abnormal findings: Secondary | ICD-10-CM | POA: Diagnosis not present

## 2018-06-17 DIAGNOSIS — Z01419 Encounter for gynecological examination (general) (routine) without abnormal findings: Secondary | ICD-10-CM | POA: Diagnosis not present

## 2018-06-17 DIAGNOSIS — Z793 Long term (current) use of hormonal contraceptives: Secondary | ICD-10-CM

## 2018-06-17 LAB — CBC WITH DIFFERENTIAL/PLATELET
BASOS ABS: 0 10*3/uL (ref 0.0–0.1)
Basophils Relative: 0.4 % (ref 0.0–3.0)
Eosinophils Absolute: 0.1 10*3/uL (ref 0.0–0.7)
Eosinophils Relative: 1 % (ref 0.0–5.0)
HCT: 37.6 % (ref 36.0–46.0)
Hemoglobin: 12.3 g/dL (ref 12.0–15.0)
LYMPHS ABS: 2.9 10*3/uL (ref 0.7–4.0)
Lymphocytes Relative: 46.3 % — ABNORMAL HIGH (ref 12.0–46.0)
MCHC: 32.8 g/dL (ref 30.0–36.0)
MCV: 83.3 fl (ref 78.0–100.0)
MONO ABS: 0.6 10*3/uL (ref 0.1–1.0)
Monocytes Relative: 10.1 % (ref 3.0–12.0)
NEUTROS PCT: 42.2 % — AB (ref 43.0–77.0)
Neutro Abs: 2.6 10*3/uL (ref 1.4–7.7)
Platelets: 284 10*3/uL (ref 150.0–400.0)
RBC: 4.52 Mil/uL (ref 3.87–5.11)
RDW: 13.4 % (ref 11.5–15.5)
WBC: 6.2 10*3/uL (ref 4.0–10.5)

## 2018-06-17 LAB — COMPREHENSIVE METABOLIC PANEL
ALK PHOS: 49 U/L (ref 39–117)
ALT: 9 U/L (ref 0–35)
AST: 15 U/L (ref 0–37)
Albumin: 3.9 g/dL (ref 3.5–5.2)
BUN: 13 mg/dL (ref 6–23)
CO2: 27 mEq/L (ref 19–32)
Calcium: 9.1 mg/dL (ref 8.4–10.5)
Chloride: 104 mEq/L (ref 96–112)
Creatinine, Ser: 0.8 mg/dL (ref 0.40–1.20)
GFR: 103.15 mL/min (ref >60.00)
GLUCOSE: 100 mg/dL — AB (ref 70–99)
Potassium: 4.2 mEq/L (ref 3.5–5.1)
SODIUM: 137 meq/L (ref 135–145)
Total Bilirubin: 0.4 mg/dL (ref 0.2–1.2)
Total Protein: 7.3 g/dL (ref 6.0–8.3)

## 2018-06-17 LAB — LIPID PANEL
Cholesterol: 189 mg/dL (ref 0–200)
HDL: 85.5 mg/dL (ref >39.00)
LDL CALC: 83 mg/dL (ref 0–99)
NONHDL: 103.28
Total CHOL/HDL Ratio: 2
Triglycerides: 101 mg/dL (ref 0.0–149.0)
VLDL: 20.2 mg/dL (ref 0.0–40.0)

## 2018-06-17 LAB — TSH: TSH: 0.9 u[IU]/mL (ref 0.35–4.50)

## 2018-06-17 MED ORDER — NORETHINDRONE ACET-ETHINYL EST 1-20 MG-MCG PO TABS: 1.0000 | ORAL_TABLET | Freq: Every day | ORAL | 11 refills | Status: DC

## 2018-06-17 MED ORDER — CYCLOBENZAPRINE HCL 10 MG PO TABS: 10.0000 mg | ORAL_TABLET | Freq: Three times a day (TID) | ORAL | 1 refills | Status: DC | PRN

## 2018-06-17 NOTE — Assessment & Plan Note (Signed)
No menses on OC  Rev pelvic US- reassuring  Wants to continue it - junel 1/20  Pap /exam done w/o abn  Pending result  Declined need for STD testing

## 2018-06-17 NOTE — Assessment & Plan Note (Signed)
This continues  Pelvic US is reassuring - nl endometrial stripe

## 2018-06-17 NOTE — Assessment & Plan Note (Signed)
Reviewed health habits including diet and exercise and skin cancer prevention Reviewed appropriate screening tests for age  Also reviewed health mt list, fam hx and immunization status , as well as social and family history   See HPI Labs ordered for wellness Disc imp of flu shot in the fall -recommend it/ she has fears  Pap/gyn exam today  Enc wt loss with healthy diet and exercise

## 2018-06-17 NOTE — Assessment & Plan Note (Signed)
occ back/shoulder spasm- improved from past  Uses flexeril occ

## 2018-06-17 NOTE — Progress Notes (Signed)
Subjective:    Patient ID: Hannah Walton, female    DOB: 08/04/1981, 37 y.o.   MRN: [id number]  HPI  Here for health maintenance exam and to review chronic medical problems    Had a good summer overall- traveling   Wt Readings from Last 3 Encounters:  06/17/18 203 lb 8 oz (92.3 kg)  06/12/17 195 lb 12.8 oz (88.8 kg)  05/11/17 197 lb 9.6 oz (89.6 kg)  exercise- not a lot /regular activity - not for exercise  Diet - not really eating healthy  Needs to make a plan  36.05 kg/m   She is trying to cut back and eat less fried foods  Eating for convenience    Flu shot - declines in past   Pap 8/18 -negative  Menses - still no periods  On junel 1/20 OC (still does w/d weeks)  Was not having menses- US a year ago showed nl endomet stripe  Had d/c 2 weeks ago- ? Yeast - then got better (could be reaction from soap)   Declines std screening  May want pregnancy in the future   Tetanus shot 6/15  Due for labs    Patient Active Problem List   Diagnosis Date Noted  . Paresthesia of left arm 05/11/2017  . Amenorrhea due to oral contraceptive 11/26/2015  . Encounter for routine gynecological examination 05/16/2015  . Routine general medical examination at a health care facility 05/08/2015  . Chronic constipation 03/07/2014   Past Medical History:  Diagnosis Date  . Cyst of knee joint 10/2014   left  . Cyst of left knee joint 10/27/2014  . History of gastric ulcer   . Sinus headache   . Synovitis of knee 10/2014   left   Past Surgical History:  Procedure Laterality Date  . KNEE ARTHROSCOPY Left 10/27/2014   Procedure: LEFT KNEE SCOPE SYNOVECTOMY;  Surgeon: Eulas PostJoshua P Landau, MD;  Location: [facility];  Service: Orthopedics;  Laterality: Left;  Marland Kitchen. MASS EXCISION Left 10/27/2014   Procedure: LEFT PCL CYST EXCISION ;  Surgeon: Eulas PostJoshua P Landau, MD;  Location:  [facility];  Service: Orthopedics;  Laterality: Left;  . UPPER GI ENDOSCOPY     Social  History   Tobacco Use  . Smoking status: Never Smoker  . Smokeless tobacco: Never Used  Substance Use Topics  . Alcohol use: No    Alcohol/week: 0.0 standard drinks  . Drug use: No   Family History  Problem Relation Age of Onset  . Arthritis Mother   . Diabetes Maternal Grandmother   . Arthritis Paternal Grandmother   . Stroke Father   . Stroke Paternal Uncle   . Diabetes Paternal Uncle    Allergies  Allergen Reactions  . Yasmin [Drospirenone-Ethinyl Estradiol] Nausea Only  . Asa [Aspirin] Other (See Comments)    DUE TO HX. OF GASTRIC ULCER  . Ibuprofen Other (See Comments)    DUE TO HX. OF GASTRIC ULCER   No current outpatient medications on file prior to visit.   No current facility-administered medications on file prior to visit.     Review of Systems  Constitutional: Negative for activity change, appetite change, fatigue, fever and unexpected weight change.  HENT: Negative for congestion, ear pain, rhinorrhea, sinus pressure and sore throat.   Eyes: Negative for pain, redness and visual disturbance.  Respiratory: Negative for cough, shortness of breath and wheezing.   Cardiovascular: Negative for chest pain and palpitations.  Gastrointestinal: Negative for abdominal pain,  blood in stool, constipation and diarrhea.  Endocrine: Negative for polydipsia and polyuria.  Genitourinary: Negative for dysuria, frequency and urgency.       Amenorrhea on OC   Musculoskeletal: Negative for arthralgias, back pain and myalgias.       Occ L spasm in scapula area  Skin: Negative for pallor and rash.  Allergic/Immunologic: Negative for environmental allergies.  Neurological: Negative for dizziness, syncope and headaches.  Hematological: Negative for adenopathy. Does not bruise/bleed easily.  Psychiatric/Behavioral: Negative for decreased concentration and dysphoric mood. The patient is not nervous/anxious.        Objective:   Physical Exam  Constitutional: She appears  well-developed and well-nourished. No distress.  obese and well appearing   HENT:  Head: Normocephalic and atraumatic.  Right Ear: External ear normal.  Left Ear: External ear normal.  Mouth/Throat: Oropharynx is clear and moist.  Eyes: Pupils are equal, round, and reactive to light. Conjunctivae and EOM are normal. No scleral icterus.  Neck: Normal range of motion. Neck supple. No JVD present. Carotid bruit is not present. No thyromegaly present.  Cardiovascular: Normal rate, regular rhythm, normal heart sounds and intact distal pulses. Exam reveals no gallop.  Pulmonary/Chest: Effort normal and breath sounds normal. No respiratory distress. She has no wheezes. She exhibits no tenderness. No breast tenderness, discharge or bleeding.  Abdominal: Soft. Bowel sounds are normal. She exhibits no distension, no abdominal bruit and no mass. There is no tenderness.  Genitourinary: No breast tenderness, discharge or bleeding.  Genitourinary Comments: Breast exam: No mass, nodules, thickening, tenderness, bulging, retraction, inflamation, nipple discharge or skin changes noted.  No axillary or clavicular LA.             Anus appears normal w/o hemorrhoids or masses     External genitalia : nl appearance and hair distribution/no lesions     Urethral meatus : nl size, no lesions or prolapse     Urethra: no masses, tenderness or scarring    Bladder : no masses or tenderness     Vagina: nl general appearance, no discharge or  Lesions, no significant cystocele  or rectocele     Cervix: no lesions/ discharge or friability    Uterus: nl size, contour, position, and mobility (not fixed) , non tender    Adnexa : no masses, tenderness, enlargement or nodularity        Musculoskeletal: Normal range of motion. She exhibits no edema or tenderness.  Lymphadenopathy:    She has no cervical adenopathy.  Neurological: She is alert. She has normal reflexes. She displays normal reflexes. No  cranial nerve deficit. She exhibits normal muscle tone. Coordination normal.  Skin: Skin is warm and dry. No rash noted. No erythema. No pallor.  Few benign appearing nevi on trunk   Psychiatric: She has a normal mood and affect.  Pleasant and talkative           Assessment & Plan:   Problem List Items Addressed This Visit      Other   Amenorrhea due to oral contraceptive    This continues  Pelvic US is reassuring - nl endometrial stripe      Encounter for routine gynecological examination    No menses on OC  Rev pelvic US- reassuring  Wants to continue it - junel 1/20  Pap /exam done w/o abn  Pending result  Declined need for STD testing       Relevant Orders   Cytology - PAP   Paresthesia of  left arm    occ back/shoulder spasm- improved from past  Uses flexeril occ       Routine general medical examination at a health care facility - Primary    Reviewed health habits including diet and exercise and skin cancer prevention Reviewed appropriate screening tests for age  Also reviewed health mt list, fam hx and immunization status , as well as social and family history   See HPI Labs ordered for wellness Disc imp of flu shot in the fall -recommend it/ she has fears  Pap/gyn exam today  Enc wt loss with healthy diet and exercise       Relevant Orders   CBC with Differential/Platelet   Comprehensive metabolic panel   Lipid panel   TSH

## 2018-06-17 NOTE — Patient Instructions (Addendum)
For weight loss Try to get most of your carbohydrates from produce (with the exception of white potatoes)  Eat less bread/pasta/rice/snack foods/cereals/sweets and other items from the middle of the grocery store (processed carbs)   Weight watchers is a good program Also myfitnesspal  Aim for 30 minutes of exercise daily   (stretching and yoga would be good for your back)   Think about a flu shot this fall   Take care of yourself

## 2018-06-22 LAB — CYTOLOGY - PAP
ADEQUACY: ABSENT
DIAGNOSIS: NEGATIVE
HPV: NOT DETECTED

## 2018-06-23 ENCOUNTER — Telehealth: Payer: Self-pay

## 2018-06-23 MED ORDER — FLUCONAZOLE 150 MG PO TABS: 150.0000 mg | ORAL_TABLET | Freq: Once | ORAL | 0 refills | Status: AC

## 2018-06-23 NOTE — Telephone Encounter (Signed)
-----   Message from Judy Pimple, MD sent at 06/22/2018  5:27 PM EDT ----- Pap is negative - (also neg HPV screen) but had evidence of yeast infection Please send in diflucan 150 mg 1 po times 1 #1 no ref

## 2018-06-23 NOTE — Telephone Encounter (Signed)
Patient advised of results and RX for Diflucan [facility] to [facility] [place], Lanetta Inch, RMA

## 2018-12-02 ENCOUNTER — Ambulatory Visit: Payer: BLUE CROSS/BLUE SHIELD | Admitting: Family Medicine

## 2018-12-06 ENCOUNTER — Encounter: Payer: Self-pay | Admitting: Family Medicine

## 2018-12-06 ENCOUNTER — Ambulatory Visit: Payer: BLUE CROSS/BLUE SHIELD | Admitting: Family Medicine

## 2018-12-06 VITALS — BP 116/68 | HR 68 | Temp 98.4°F | Ht 63.0 in | Wt 203.2 lb

## 2018-12-06 DIAGNOSIS — M62830 Muscle spasm of back: Secondary | ICD-10-CM | POA: Diagnosis not present

## 2018-12-06 MED ORDER — CYCLOBENZAPRINE HCL 10 MG PO TABS: 10.0000 mg | ORAL_TABLET | Freq: Three times a day (TID) | ORAL | 1 refills | Status: DC | PRN

## 2018-12-06 NOTE — Progress Notes (Signed)
Subjective:    Patient ID: Hannah Walton, female    DOB: 12-16-1980, 38 y.o.   MRN: [id number]  HPI Here with c/o of back spasms for 2 mo   Wt Readings from Last 3 Encounters:  12/06/18 203 lb 3 oz (92.2 kg)  06/17/18 203 lb 8 oz (92.3 kg)  06/12/17 195 lb 12.8 oz (88.8 kg)   35.99 kg/m   Started 2-3 mo ago  Taking flexeril if needed and needs a refill (makes her sleepy)  Has back "spasms" They happen more after she eats at work (eats light)   Her back spasms can be anywhere in her back (upper or lower)  Usually just one side  Has on average 2 back spasms per day  A spasm lasts about an hour  No numbness or weakness  Feels like a sharp pain   Helps to walk    She walks a mile per day (area at work has a place to do it)  Uses stairs instead of elevator  Uses some machines in the fitness room  (does pull downs and leg press and a pulling machine)  She does lift trays in dining dept as well    Patient Active Problem List   Diagnosis Date Noted  . Spasm of back muscles 12/06/2018  . Paresthesia of left arm 05/11/2017  . Amenorrhea due to oral contraceptive 11/26/2015  . Encounter for routine gynecological examination 05/16/2015  . Routine general medical examination at a health care facility 05/08/2015  . Chronic constipation 03/07/2014   Past Medical History:  Diagnosis Date  . Cyst of knee joint 10/2014   left  . Cyst of left knee joint 10/27/2014  . History of gastric ulcer   . Sinus headache   . Synovitis of knee 10/2014   left   Past Surgical History:  Procedure Laterality Date  . KNEE ARTHROSCOPY Left 10/27/2014   Procedure: LEFT KNEE SCOPE SYNOVECTOMY;  Surgeon: Eulas Post, MD;  Location: [facility];  Service: Orthopedics;  Laterality: Left;  Marland Kitchen MASS EXCISION Left 10/27/2014   Procedure: LEFT PCL CYST EXCISION ;  Surgeon: Eulas Post, MD;  Location: [facility];  Service: Orthopedics;  Laterality: Left;  . UPPER  GI ENDOSCOPY     Social History   Tobacco Use  . Smoking status: Never Smoker  . Smokeless tobacco: Never Used  Substance Use Topics  . Alcohol use: No    Alcohol/week: 0.0 standard drinks  . Drug use: No   Family History  Problem Relation Age of Onset  . Arthritis Mother   . Diabetes Maternal Grandmother   . Arthritis Paternal Grandmother   . Stroke Father   . Stroke Paternal Uncle   . Diabetes Paternal Uncle    Allergies  Allergen Reactions  . Yasmin [Drospirenone-Ethinyl Estradiol] Nausea Only  . Asa [Aspirin] Other (See Comments)    DUE TO HX. OF GASTRIC ULCER  . Ibuprofen Other (See Comments)    DUE TO HX. OF GASTRIC ULCER   Current Outpatient Medications on File Prior to Visit  Medication Sig Dispense Refill  . norethindrone-ethinyl estradiol (JUNEL 1/20) 1-20 MG-MCG tablet Take 1 tablet by mouth daily. 1 Package 11   No current facility-administered medications on file prior to visit.      Review of Systems  Constitutional: Negative for activity change, appetite change, fatigue, fever and unexpected weight change.  HENT: Negative for congestion, ear pain, rhinorrhea, sinus pressure and sore throat.  Eyes: Negative for pain, redness and visual disturbance.  Respiratory: Negative for cough, shortness of breath and wheezing.   Cardiovascular: Negative for chest pain and palpitations.  Gastrointestinal: Negative for abdominal pain, blood in stool, constipation and diarrhea.  Endocrine: Negative for polydipsia and polyuria.  Genitourinary: Negative for dysuria, frequency and urgency.  Musculoskeletal: Positive for back pain. Negative for arthralgias, gait problem, joint swelling and myalgias.  Skin: Negative for pallor and rash.  Allergic/Immunologic: Negative for environmental allergies.  Neurological: Negative for dizziness, syncope and headaches.  Hematological: Negative for adenopathy. Does not bruise/bleed easily.  Psychiatric/Behavioral: Negative for  decreased concentration and dysphoric mood. The patient is not nervous/anxious.        Objective:   Physical Exam Constitutional:      General: She is not in acute distress.    Appearance: Normal appearance. She is obese. She is not ill-appearing.  HENT:     Head: Normocephalic and atraumatic.     Mouth/Throat:     Mouth: Mucous membranes are moist.     Pharynx: Oropharynx is clear.  Eyes:     General: No scleral icterus.    Conjunctiva/sclera: Conjunctivae normal.     Pupils: Pupils are equal, round, and reactive to light.  Neck:     Musculoskeletal: Normal range of motion. No neck rigidity or muscular tenderness.  Cardiovascular:     Rate and Rhythm: Normal rate and regular rhythm.  Pulmonary:     Effort: Pulmonary effort is normal. No respiratory distress.     Breath sounds: Normal breath sounds. No rales.  Musculoskeletal:        General: Tenderness present. No swelling or deformity.     Right lower leg: No edema.     Left lower leg: No edema.     Comments: Tender over L rhomboid area with tight thoracic musculature  No skin change  No joint swelling Nl rom of TS and LS  Gait ins normal  No scoliosis   Lymphadenopathy:     Cervical: No cervical adenopathy.  Skin:    General: Skin is warm and dry.     Capillary Refill: Capillary refill takes less than 2 seconds.     Findings: No erythema or rash.  Neurological:     General: No focal deficit present.     Mental Status: She is alert.     Motor: No weakness.     Coordination: Coordination normal.     Deep Tendon Reflexes: Reflexes normal.  Psychiatric:        Mood and Affect: Mood normal.           Assessment & Plan:   Problem List Items Addressed This Visit      Other   Spasm of back muscles - Primary    Intermittent and daily - thoracic and lumbar  Flexeril and heat is helpful  inst to stop pull down gym machine until improved Ref to PT for eval and tx Enc to continue her cardio exercise        Relevant Orders   Ambulatory referral to Physical Therapy

## 2018-12-06 NOTE — Patient Instructions (Addendum)
Avoid the pull down machine just for now  Use heat on your back if it hurts  Stretch - try the rhomboid one I taught you   I will refill your flexeril for pm use   We will call you re: a referral to PT

## 2018-12-06 NOTE — Assessment & Plan Note (Signed)
Intermittent and daily - thoracic and lumbar  Flexeril and heat is helpful  inst to stop pull down gym machine until improved Ref to PT for eval and tx Enc to continue her cardio exercise

## 2019-05-12 ENCOUNTER — Ambulatory Visit (INDEPENDENT_AMBULATORY_CARE_PROVIDER_SITE_OTHER): Payer: BC Managed Care – PPO | Admitting: Family Medicine

## 2019-05-12 ENCOUNTER — Other Ambulatory Visit: Payer: Self-pay

## 2019-05-12 ENCOUNTER — Encounter: Payer: Self-pay | Admitting: Family Medicine

## 2019-05-12 VITALS — BP 130/88 | HR 66 | Temp 98.4°F | Ht 63.0 in | Wt 197.2 lb

## 2019-05-12 DIAGNOSIS — R519 Headache, unspecified: Secondary | ICD-10-CM | POA: Insufficient documentation

## 2019-05-12 DIAGNOSIS — G4452 New daily persistent headache (NDPH): Secondary | ICD-10-CM | POA: Diagnosis not present

## 2019-05-12 DIAGNOSIS — R030 Elevated blood-pressure reading, without diagnosis of hypertension: Secondary | ICD-10-CM | POA: Diagnosis not present

## 2019-05-12 MED ORDER — CYCLOBENZAPRINE HCL 10 MG PO TABS: 10.0000 mg | ORAL_TABLET | Freq: Three times a day (TID) | ORAL | 1 refills | Status: DC | PRN

## 2019-05-12 NOTE — Assessment & Plan Note (Signed)
Unsure if caused by or resulting from intermittent elevated bp  Some features of migraine and tension  Disc multiple causes incl allergies/dehydration/stress as well  Will hold OC to see if bp comes down (consider tx if not) Start back on flonase Use flexeril as needed  Inc fluids to 64 oz or more per day  Continue working on healthy habits F/u 1 mo (call earlier if not improving _

## 2019-05-12 NOTE — Patient Instructions (Addendum)
For further weight loss Keep exercising  Try to get most of your carbohydrates from produce (with the exception of white potatoes)  Eat less bread/pasta/rice/snack foods/cereals/sweets and other items from the middle of the grocery store (processed carbs)   Start flonase every day  Drink 64 oz of fluids daily -mostly water (or more if you are sweating a lot)   Avoid caffeine   Stop your oral contraceptive for a month - use back up protection   Check your blood pressure at work - after you have taken a break and relaxed   If headache does not improve- let me know  If BP stays high at work let me know  Goal is under 140/90  Avoid excess sodium   Follow up with me in about a month before you start back on the pill

## 2019-05-12 NOTE — Assessment & Plan Note (Signed)
Higher at work and home BP: 130/88  Disc poss role of OC-will hold it and use back up contraception for a month  Also work on diet/exercise- DASH eating and continue wt loss effort with walking  F/u about a month Continue to monitor at home

## 2019-05-12 NOTE — Progress Notes (Signed)
Subjective:    Patient ID: Hannah Walton, female    DOB: 09/26/1981, 38 y.o.   MRN: [id number]  HPI Here for BP concern  Wt Readings from Last 3 Encounters:  05/12/19 197 lb 3 oz (89.4 kg)  12/06/18 203 lb 3 oz (92.2 kg)  06/17/18 203 lb 8 oz (92.3 kg)  down 6 lb from February  She is working at it  Walking a lot for the past 2 months -in the neighborhood  Eating baked foods instead of fried  Needs to cut back on rice 34.93 kg/m   She started getting a headache on Monday late  She sometimes gets sinus problems -but no nasal symptoms   A naproxen helped yesterday   At work bp was 162/101   No regular allergy medicines flonase as needed Afrin as needed  Benadryl as needed   Headache right between eyes and up into forehead  Constant-not throbbing  Worse with exertion  A little light sensitive  Some nausea /no vomiting occ a little dizzy   No numbness/weakness  Has fam hx of stroke and HTN   BP Readings from Last 3 Encounters:  05/12/19 130/88  12/06/18 116/68  06/17/18 124/82   Patient Active Problem List   Diagnosis Date Noted  . Elevated BP without diagnosis of hypertension 05/12/2019  . Headache 05/12/2019  . Spasm of back muscles 12/06/2018  . Paresthesia of left arm 05/11/2017  . Amenorrhea due to oral contraceptive 11/26/2015  . Encounter for routine gynecological examination 05/16/2015  . Routine general medical examination at a health care facility 05/08/2015  . Chronic constipation 03/07/2014   Past Medical History:  Diagnosis Date  . Cyst of knee joint 10/2014   left  . Cyst of left knee joint 10/27/2014  . History of gastric ulcer   . Sinus headache   . Synovitis of knee 10/2014   left   Past Surgical History:  Procedure Laterality Date  . KNEE ARTHROSCOPY Left 10/27/2014   Procedure: LEFT KNEE SCOPE SYNOVECTOMY;  Surgeon: Eulas PostJoshua P Landau, MD;  Location: [facility];  Service: Orthopedics;  Laterality: Left;  Marland Kitchen. MASS  EXCISION Left 10/27/2014   Procedure: LEFT PCL CYST EXCISION ;  Surgeon: Eulas PostJoshua P Landau, MD;  Location: [facility];  Service: Orthopedics;  Laterality: Left;  . UPPER GI ENDOSCOPY     Social History   Tobacco Use  . Smoking status: Never Smoker  . Smokeless tobacco: Never Used  Substance Use Topics  . Alcohol use: No    Alcohol/week: 0.0 standard drinks  . Drug use: No   Family History  Problem Relation Age of Onset  . Arthritis Mother   . Diabetes Maternal Grandmother   . Arthritis Paternal Grandmother   . Stroke Father   . Stroke Paternal Uncle   . Diabetes Paternal Uncle    Allergies  Allergen Reactions  . Yasmin [Drospirenone-Ethinyl Estradiol] Nausea Only  . Asa [Aspirin] Other (See Comments)    DUE TO HX. OF GASTRIC ULCER  . Ibuprofen Other (See Comments)    DUE TO HX. OF GASTRIC ULCER   Current Outpatient Medications on File Prior to Visit  Medication Sig Dispense Refill  . norethindrone-ethinyl estradiol (JUNEL 1/20) 1-20 MG-MCG tablet Take 1 tablet by mouth daily. 1 Package 11   No current facility-administered medications on file prior to visit.     Review of Systems  Constitutional: Negative for activity change, appetite change, fatigue, fever and unexpected weight change.  HENT: Negative for congestion, ear pain, rhinorrhea, sinus pressure and sore throat.   Eyes: Negative for pain, redness and visual disturbance.  Respiratory: Negative for cough, shortness of breath and wheezing.   Cardiovascular: Negative for chest pain, palpitations and leg swelling.  Gastrointestinal: Positive for nausea. Negative for abdominal distention, abdominal pain, blood in stool, constipation, diarrhea and vomiting.  Endocrine: Negative for polydipsia and polyuria.  Genitourinary: Negative for dysuria, frequency and urgency.  Musculoskeletal: Negative for arthralgias, back pain and myalgias.  Skin: Negative for pallor and rash.  Allergic/Immunologic: Positive for  environmental allergies. Negative for food allergies.  Neurological: Positive for dizziness and headaches. Negative for tremors, seizures, syncope, facial asymmetry, speech difficulty, weakness, light-headedness and numbness.  Hematological: Negative for adenopathy. Does not bruise/bleed easily.  Psychiatric/Behavioral: Negative for decreased concentration and dysphoric mood. The patient is not nervous/anxious.        Objective:   Physical Exam Constitutional:      General: She is not in acute distress.    Appearance: Normal appearance. She is well-developed. She is obese. She is not ill-appearing or diaphoretic.  HENT:     Head: Normocephalic and atraumatic.     Comments: No facial or temporal tenderness    Right Ear: Tympanic membrane, ear canal and external ear normal.     Left Ear: Tympanic membrane, ear canal and external ear normal.     Nose: Rhinorrhea present.     Comments: Boggy nares    Mouth/Throat:     Mouth: Mucous membranes are moist.     Pharynx: Oropharynx is clear. No oropharyngeal exudate or posterior oropharyngeal erythema.  Eyes:     General: No scleral icterus.       Right eye: No discharge.        Left eye: No discharge.     Conjunctiva/sclera: Conjunctivae normal.     Pupils: Pupils are equal, round, and reactive to light.     Comments: No nystagmus  Neck:     Musculoskeletal: Full passive range of motion without pain, normal range of motion and neck supple. No neck rigidity or muscular tenderness.     Thyroid: No thyromegaly.     Vascular: No carotid bruit or JVD.     Trachea: No tracheal deviation.  Cardiovascular:     Rate and Rhythm: Normal rate and regular rhythm.     Heart sounds: Normal heart sounds. No murmur.  Pulmonary:     Effort: Pulmonary effort is normal. No respiratory distress.     Breath sounds: Normal breath sounds. No wheezing or rales.  Abdominal:     General: Bowel sounds are normal. There is no distension.     Palpations: Abdomen  is soft. There is no mass.     Tenderness: There is no abdominal tenderness.  Musculoskeletal:        General: No tenderness.     Right lower leg: No edema.     Left lower leg: No edema.  Lymphadenopathy:     Cervical: No cervical adenopathy.  Skin:    General: Skin is warm and dry.     Coloration: Skin is not pale.     Findings: No erythema, lesion or rash.  Neurological:     General: No focal deficit present.     Mental Status: She is alert and oriented to person, place, and time.     Cranial Nerves: No cranial nerve deficit.     Sensory: No sensory deficit.     Motor: No weakness,  tremor, atrophy or abnormal muscle tone.     Coordination: Coordination normal.     Gait: Gait normal.     Deep Tendon Reflexes: Reflexes are normal and symmetric. Reflexes normal.     Comments: No focal cerebellar signs   Psychiatric:        Mood and Affect: Mood normal.        Behavior: Behavior normal.        Thought Content: Thought content normal.           Assessment & Plan:   Problem List Items Addressed This Visit      Other   Elevated BP without diagnosis of hypertension    Higher at work and home BP: 130/88  Disc poss role of OC-will hold it and use back up contraception for a month  Also work on diet/exercise- DASH eating and continue wt loss effort with walking  F/u about a month Continue to monitor at home       Headache - Primary    Unsure if caused by or resulting from intermittent elevated bp  Some features of migraine and tension  Disc multiple causes incl allergies/dehydration/stress as well  Will hold OC to see if bp comes down (consider tx if not) Start back on flonase Use flexeril as needed  Inc fluids to 64 oz or more per day  Continue working on healthy habits F/u 1 mo (call earlier if not improving _       Relevant Medications   cyclobenzaprine (FLEXERIL) 10 MG tablet

## 2019-05-13 ENCOUNTER — Encounter (HOSPITAL_COMMUNITY): Payer: Self-pay | Admitting: Emergency Medicine

## 2019-05-13 ENCOUNTER — Emergency Department (HOSPITAL_COMMUNITY)
Admission: EM | Admit: 2019-05-13 | Discharge: 2019-05-13 | Disposition: A | Discharge status: Home / Self Care | Payer: BC Managed Care – PPO | Attending: Emergency Medicine | Admitting: Emergency Medicine

## 2019-05-13 ENCOUNTER — Telehealth: Payer: Self-pay | Admitting: *Deleted

## 2019-05-13 ENCOUNTER — Other Ambulatory Visit: Payer: Self-pay

## 2019-05-13 DIAGNOSIS — I10 Essential (primary) hypertension: Secondary | ICD-10-CM | POA: Diagnosis not present

## 2019-05-13 DIAGNOSIS — G43009 Migraine without aura, not intractable, without status migrainosus: Secondary | ICD-10-CM | POA: Insufficient documentation

## 2019-05-13 DIAGNOSIS — R030 Elevated blood-pressure reading, without diagnosis of hypertension: Secondary | ICD-10-CM

## 2019-05-13 DIAGNOSIS — R519 Headache, unspecified: Secondary | ICD-10-CM

## 2019-05-13 DIAGNOSIS — R51 Headache: Secondary | ICD-10-CM | POA: Diagnosis not present

## 2019-05-13 DIAGNOSIS — Z79899 Other long term (current) drug therapy: Secondary | ICD-10-CM | POA: Insufficient documentation

## 2019-05-13 LAB — URINALYSIS, ROUTINE W REFLEX MICROSCOPIC
Bacteria, UA: NONE SEEN
Bilirubin Urine: NEGATIVE
Glucose, UA: NEGATIVE mg/dL
Ketones, ur: NEGATIVE mg/dL
Leukocytes,Ua: NEGATIVE
Nitrite: NEGATIVE
Protein, ur: NEGATIVE mg/dL
Specific Gravity, Urine: 1.02 (ref 1.005–1.030)
pH: 5 (ref 5.0–8.0)

## 2019-05-13 LAB — BASIC METABOLIC PANEL
Anion gap: 11 (ref 5–15)
BUN: 8 mg/dL (ref 6–20)
CO2: 20 mmol/L — ABNORMAL LOW (ref 22–32)
Calcium: 9 mg/dL (ref 8.9–10.3)
Chloride: 105 mmol/L (ref 98–111)
Creatinine, Ser: 0.91 mg/dL (ref 0.44–1.00)
GFR calc Af Amer: >60 mL/min (ref >60)
GFR calc non Af Amer: >60 mL/min (ref >60)
Glucose, Bld: 87 mg/dL (ref 70–99)
Potassium: 3.7 mmol/L (ref 3.5–5.1)
Sodium: 136 mmol/L (ref 135–145)

## 2019-05-13 LAB — CBC
HCT: 38.2 % (ref 36.0–46.0)
Hemoglobin: 12.2 g/dL (ref 12.0–15.0)
MCH: 27.3 pg (ref 26.0–34.0)
MCHC: 31.9 g/dL (ref 30.0–36.0)
MCV: 85.5 fL (ref 80.0–100.0)
Platelets: 266 10*3/uL (ref 150–400)
RBC: 4.47 MIL/uL (ref 3.87–5.11)
RDW: 12.8 % (ref 11.5–15.5)
WBC: 6.7 10*3/uL (ref 4.0–10.5)
nRBC: 0 % (ref 0.0–0.2)

## 2019-05-13 LAB — I-STAT BETA HCG BLOOD, ED (MC, WL, AP ONLY): I-stat hCG, quantitative: <5 m[IU]/mL (ref <5)

## 2019-05-13 MED ORDER — METOCLOPRAMIDE HCL 5 MG/ML IJ SOLN
10.0000 mg | Freq: Once | INTRAMUSCULAR | Status: AC
  Administered 2019-05-13: 21:00:00 10 mg via INTRAVENOUS
  Filled 2019-05-13: qty 2

## 2019-05-13 MED ORDER — DIPHENHYDRAMINE HCL 50 MG/ML IJ SOLN
12.5000 mg | Freq: Once | INTRAMUSCULAR | Status: AC
  Administered 2019-05-13: 21:00:00 12.5 mg via INTRAVENOUS
  Filled 2019-05-13: qty 1

## 2019-05-13 MED ORDER — KETOROLAC TROMETHAMINE 15 MG/ML IJ SOLN
15.0000 mg | Freq: Once | INTRAMUSCULAR | Status: AC
  Administered 2019-05-13: 21:00:00 15 mg via INTRAVENOUS
  Filled 2019-05-13: qty 1

## 2019-05-13 MED ORDER — SODIUM CHLORIDE 0.9 % IV BOLUS
1000.0000 mL | Freq: Once | INTRAVENOUS | Status: AC
  Administered 2019-05-13: 21:00:00 1000 mL via INTRAVENOUS

## 2019-05-13 NOTE — ED Provider Notes (Signed)
[facility] EMERGENCY DEPARTMENT Provider Note   CSN: [id number] Arrival date & time: 05/13/19  1500     History   Chief Complaint Chief Complaint  Patient presents with  . Headache  . Hypertension    HPI Nare L Oliver-McCollum is a 38 y.o. female.     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 She is also concerned abuot hypertension.  Her headache is frontal, it is described as a throbbing pain that is 8/10.  It is not worst in the morning and was not described as a thunderclap headache. it has been persistent since its onset several days ago.  She took 2 naproxen and around 1300 hrs. today without any relief.  She was seen for this by her PCP yesterday and her blood pressure was found to be 130/88 at her PCPs office, they also discussed multiple reasons for her persistent headache and felt that it was most likely that she was dehydrated.  She denies any recent fever or chills.  She denies any visual disturbance.  She denies any gait abnormality, sore throat, nausea, vomiting, chest pain, palpitations, diarrhea or constipation.  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  Past Medical History:  Diagnosis Date  . Cyst of knee joint 10/2014   left  . Cyst of left knee joint 10/27/2014  . History of gastric ulcer   . Sinus headache   . Synovitis of knee 10/2014   left    Patient Active Problem List   Diagnosis Date Noted  . Elevated BP without diagnosis of hypertension 05/12/2019  . Headache 05/12/2019  . Spasm of back muscles 12/06/2018  . Paresthesia of left arm 05/11/2017  . Amenorrhea due to oral contraceptive 11/26/2015  . Encounter for  routine gynecological examination 05/16/2015  . Routine general medical examination at a health care facility 05/08/2015  . Chronic constipation 03/07/2014    Past Surgical History:  Procedure Laterality Date  . KNEE ARTHROSCOPY Left 10/27/2014   Procedure: LEFT KNEE SCOPE SYNOVECTOMY;  Surgeon: Johnny Bridge, MD;  Location: [facility];  Service: Orthopedics;  Laterality: Left;  Marland Kitchen MASS EXCISION Left 10/27/2014   Procedure: LEFT PCL CYST EXCISION ;  Surgeon: Johnny Bridge, MD;  Location: [facility];  Service: Orthopedics;  Laterality: Left;  . UPPER GI ENDOSCOPY       OB History   No obstetric history on file.      Home Medications    Prior to Admission medications   Medication Sig Start Date End Date Taking? Authorizing Provider  cyclobenzaprine (FLEXERIL) 10 MG tablet Take 1 tablet (10 mg total) by mouth 3 (three) times daily as needed for muscle spasms. When not working or driving 06/19/50   Tower, Wynelle Fanny, MD  norethindrone-ethinyl estradiol (JUNEL 1/20) 1-20 MG-MCG tablet Take 1 tablet by mouth daily. 06/17/18   Tower, Wynelle Fanny, MD    Family History Family History  Problem Relation Age of Onset  . Arthritis Mother   . Diabetes Maternal Grandmother   . Arthritis Paternal Grandmother   . Stroke Father   . Stroke Paternal Uncle   . Diabetes Paternal Uncle  Social History Social History   Tobacco Use  . Smoking status: Never Smoker  . Smokeless tobacco: Never Used  Substance Use Topics  . Alcohol use: No    Alcohol/week: 0.0 standard drinks  . Drug use: No     Allergies   Yasmin [drospirenone-ethinyl estradiol], Asa [aspirin], and Ibuprofen   Review of Systems Review of Systems  Constitutional: Negative for chills and fever.  HENT: Negative for ear pain and sore throat.   Eyes: Negative for pain and visual disturbance.  Respiratory: Negative for cough and shortness of breath.   Cardiovascular: Negative for chest pain and  palpitations.  Gastrointestinal: Negative for abdominal pain and vomiting.  Genitourinary: Negative for dysuria and hematuria.  Musculoskeletal: Negative for arthralgias and back pain.  Skin: Negative for color change and rash.  Neurological: Positive for light-headedness (occasional) and headaches. Negative for seizures, syncope, weakness and numbness.  All other systems reviewed and are negative.    Physical Exam Updated Vital Signs BP (!) 151/93   Pulse 78   Temp 98.7 F (37.1 C) (Oral)   Resp 16   Ht 5\' 2"  (1.575 m)   Wt 88.5 kg   SpO2 100%   BMI 35.67 kg/m   Physical Exam Vitals signs and nursing note reviewed.  Constitutional:      General: She is not in acute distress.    Appearance: She is well-developed and overweight. She is not ill-appearing or toxic-appearing.  HENT:     Head: Normocephalic and atraumatic.     Left Ear: External ear normal.     Nose: Nose normal.     Mouth/Throat:     Mouth: Mucous membranes are moist.     Pharynx: Oropharynx is clear.  Eyes:     General: No visual field deficit or scleral icterus.    Extraocular Movements: Extraocular movements intact.     Right eye: Normal extraocular motion and no nystagmus.     Left eye: Normal extraocular motion and no nystagmus.     Conjunctiva/sclera: Conjunctivae normal.     Pupils: Pupils are equal, round, and reactive to light. Pupils are equal.  Neck:     Musculoskeletal: Normal range of motion and neck supple. No neck rigidity.  Cardiovascular:     Rate and Rhythm: Normal rate and regular rhythm.     Pulses: Normal pulses.     Heart sounds: Normal heart sounds.  Pulmonary:     Effort: Pulmonary effort is normal. No respiratory distress.     Breath sounds: Normal breath sounds.  Abdominal:     General: There is no distension.     Palpations: Abdomen is soft.     Tenderness: There is no abdominal tenderness.  Musculoskeletal: Normal range of motion.  Lymphadenopathy:     Cervical: No  cervical adenopathy.  Skin:    General: Skin is warm and dry.     Capillary Refill: Capillary refill takes less than 2 seconds.  Neurological:     General: No focal deficit present.     Mental Status: She is alert and oriented to person, place, and time.     GCS: GCS eye subscore is 4. GCS verbal subscore is 5. GCS motor subscore is 6.     Cranial Nerves: No cranial nerve deficit, dysarthria or facial asymmetry.     Comments:  Cranial Nerves:  II: Intact to confrontation.  III, IV, VI:  Pupils equal, round and reactive to light . Full eye movements without nystagmus  V, VII:  Facial sensation intact bilaterally. No weakness of masticatory muscles and smile is symmetric VIII Auditory Acuity: Grossly normal  IX/X: The uvula is midline; the palate elevates symmetrically  XI: shoulder shrug symmetric  XII: The tongue protrudes midline.    Motor System: Muscle Strength: 5/5 and symmetric in the upper and lower extremities. No pronation or drift of BUE. Muscle Tone: Tone and muscle bulk are symmetric in the upper and lower extremities.   Reflexes: DTRs: 2+ and symmetric to bilateral patella Coordination:  Intact finger-to-nose and no tremor.  Sensation: Intact to light touch Gait: ambulates independently without difficulty or notable abnormality      ED Treatments / Results  Labs (all labs ordered are listed, but only abnormal results are displayed) Labs Reviewed  BASIC METABOLIC PANEL - Abnormal; Notable for the following components:      Result Value   CO2 20 (*)    All other components within normal limits  URINALYSIS, ROUTINE W REFLEX MICROSCOPIC - Abnormal; Notable for the following components:   Hgb urine dipstick SMALL (*)    All other components within normal limits  CBC  I-STAT BETA HCG BLOOD, ED ([facility], [facility], [facility] ONLY)    EKG EKG Interpretation  Date/Time:  Friday May 13 2019 20:18:57 EDT Ventricular Rate:  60 PR Interval:    QRS Duration: 74 QT Interval:  375 QTC  Calculation: 375 R Axis:   52 Text Interpretation:  Sinus rhythm Low voltage, precordial leads Borderline repolarization abnormality since last tracing no significant change Confirmed by Eber HongMiller, Brian ([id number]) on 05/13/2019 8:35:37 PM   Radiology No results found.  Procedures Procedures (including critical care time)  Medications Ordered in ED Medications  sodium chloride 0.9 % bolus 1,000 mL (0 mLs Intravenous Stopped 05/13/19 2215)  metoCLOPramide (REGLAN) injection 10 mg (10 mg Intravenous Given 05/13/19 2033)  diphenhydrAMINE (BENADRYL) injection 12.5 mg (12.5 mg Intravenous Given 05/13/19 2033)  ketorolac (TORADOL) 15 MG/ML injection 15 mg (15 mg Intravenous Given 05/13/19 2033)     Initial Impression / Assessment and Plan / ED Course  I have reviewed the triage vital signs and the nursing notes.  Pertinent labs & imaging results that were available during my care of the patient were reviewed by me and considered in my medical decision making (see chart for details).  Of note, this patient was evaluated in the Emergency Department for the symptoms described in the history of present illness. She was evaluated in the context of the global COVID-19 pandemic, which necessitated consideration that the patient might be at risk for infection with the SARS-CoV-2 virus that causes COVID-19. Institutional protocols and algorithms that pertain to the evaluation of patients at risk for COVID-19 are in a state of rapid change based on information released by regulatory bodies including the CDC and federal and state organizations. These policies and algorithms were followed during the patient's care in the ED.  During this patient encounter, the patient was wearing a mask, and throughout this encounter I was wearing at least a surgical mask.  I was not within 6 feet of this patient for more than 15 minutes without eye protection when they were not wearing mask.   Differentials considered: Migraine  headache, tension headache, cluster headache, complex migraine, hemiplegic migraine  EM Physician interpretation of Labs & Imaging: . CBC and BMP grossly unremarkable with slightly decreased CO2 of 20. Marland Kitchen. HCG negative > not pregnant . UA with very small presence of blood, otherwise rather unremarkable urinalysis  Medical Decision  Making:  Keilynn L Oliver-McCollum is a 38 y.o. female without significant past medical history who presented to the emergency department today for evaluation of a 3-day history of a persistent frontal headache, primarily right-sided.  Further HPI as above.  She arrived afebrile and hemodynamically stable.  She has a reassuring physical examination without focal neurologic deficits.  She also reported hypertension, measured by a coworker today at her work.   We will give a migraine cocktail with 1 L IV fluid, 15 mg Toradol, Reglan and Benadryl and reassess.  I doubt subarachnoid or intracranial hemorrhage; headache is not thunderclap, was not sudden in onset, has been gradual and persistent, it is not worse in the morning.    On reassessment her headache was nearly resolved, she had a mild residual frontal headache.  Her neurologic examination was repeated and remained intact without acute neurologic deficits.  She also ambulated in the department independently and without assistance or gait abnormality.  I have little concern for cavernous venous thrombosis, intracranial mass lesion or other intracranial abnormality.  She was encouraged to stay well-hydrated, to keep a blood pressure log and follow-up with her primary care physician in the next available ED follow-up visit.  The plan for this patient was discussed with my attending physician, Dr. Eber HongBrian Miller, who voiced agreement and who oversaw evaluation and treatment of this patient.   CLINICAL IMPRESSION: 1. Migraine without aura and without status migrainosus, not intractable   2. Elevated blood pressure reading    3. Frontal headache      Disposition: Discharge  Key discharge instructions: Strict return precautions provided. Patient was encouraged to return to the ED should they experience worsening or persistence of current symptoms, or should they develop new concerning symptoms. Encouraged them to f/u with their PCP on an outpatient basis. Questions regarding the diagnosis were answered, and side effects regarding therapies were provided in writing or orally. Patient discharged in stable condition.   Wheeler Incorvaia A. Mayford KnifeWilliams, MD Resident Physician, PGY-3 Emergency Medicine [facility] [employer]    Saverio DankerWilliams, Cherie Lasalle A, MD 05/14/19 [id number]    Eber HongMiller, Brian, MD 05/14/19 1336

## 2019-05-13 NOTE — Telephone Encounter (Signed)
Pt called back because she is feeling bad. Pt has H/a, dizziness, hard to breathe, no CP. Pt has been drinking water but feeling weak, pt needs to sit; feels tired, pt usually walks fast at work but today pt could not walk fast and was not having trouble walking but was walking slowly. No problem grasping things. Pt said nurse at work took previous BP in this note and pt rested 15' in between reading and the high heart rate of 215. Pt is going to [facility] ED now; pts husband has already picked her up from work. FYI to Dr Glori Bickers.

## 2019-05-13 NOTE — Telephone Encounter (Signed)
Pt left VM at Triage. She checked her BP twice: 185/118 with a pulse of 115, and 156/100 with a pulse of 215, pt stated on the VM that she still has a HA

## 2019-05-13 NOTE — Telephone Encounter (Signed)
Thanks- it looks like she is in the ED now - I will follow

## 2019-05-13 NOTE — ED Triage Notes (Signed)
Pt. Stated,  Hannah Walton had a headache and they took my BP at work and said it was high

## 2019-05-13 NOTE — ED Notes (Signed)
Patient verbalizes understanding of discharge instructions. Opportunity for questioning and answers were provided. Armband removed by staff, pt discharged from ED.  

## 2019-05-13 NOTE — Telephone Encounter (Signed)
That is a very big difference than she was here.   Is It possibly not an accurate cuff?  Was she relaxed? (HR was high, any cp or palpitations)  Is headache worse than it was? Thanks

## 2019-05-16 ENCOUNTER — Encounter: Payer: Self-pay | Admitting: Family Medicine

## 2019-05-16 ENCOUNTER — Telehealth: Payer: Self-pay

## 2019-05-16 ENCOUNTER — Ambulatory Visit (INDEPENDENT_AMBULATORY_CARE_PROVIDER_SITE_OTHER): Payer: BC Managed Care – PPO | Admitting: Family Medicine

## 2019-05-16 ENCOUNTER — Other Ambulatory Visit: Payer: Self-pay

## 2019-05-16 VITALS — BP 146/84 | HR 68 | Temp 98.3°F | Ht 63.0 in | Wt 198.2 lb

## 2019-05-16 DIAGNOSIS — R030 Elevated blood-pressure reading, without diagnosis of hypertension: Secondary | ICD-10-CM | POA: Diagnosis not present

## 2019-05-16 DIAGNOSIS — G4452 New daily persistent headache (NDPH): Secondary | ICD-10-CM

## 2019-05-16 MED ORDER — GABAPENTIN 100 MG PO CAPS: 100.0000 mg | ORAL_CAPSULE | Freq: Three times a day (TID) | ORAL | 3 refills | Status: DC

## 2019-05-16 MED ORDER — PREDNISONE 10 MG PO TABS: ORAL_TABLET | ORAL | 0 refills | Status: DC

## 2019-05-16 NOTE — Assessment & Plan Note (Addendum)
bp is trending down - suspect the cuff used at work is inaccurate Suspect headache makes bp high as opposed to vice versa  Enc good health habits BP: (!) 146/84 currently off OC bp was reassuring in ED Reviewed hospital records, lab results and studies in detail     Continue to monitor  Working on headache relief

## 2019-05-16 NOTE — Telephone Encounter (Signed)
Patient was seen today in office by PCP.   Refer to encounter note.

## 2019-05-16 NOTE — Telephone Encounter (Signed)
[facility] Night - Client TELEPHONE ADVICE RECORD [facility] Patient Name: Hannah OLIVER MCCO Walton Gender: Female DOB: 08/11/1981 Age: 38 Y 23 D Return Phone Number: [phone] (Primary), [phone] (Secondary) Address: City/State/Zip: [place] [address] Client [facility] Night - Client Client Site [facility] - Night Physician Tower, Marne - MD Contact Type Call Who Is Calling Patient / Member / Family / Caregiver Call Type Triage / Clinical Relationship To Patient Self Return Phone Number [phone] (Primary) Chief Complaint Blood Pressure High Reason for Call Request to Schedule Office Appointment Initial Comment Caller states she needs to make an appt for tomorrow. Caller states she was in on Thursday for her BP and went to work on Friday and felt ill. Caller states she went to the ED and was sent home with the high BP. Caller states BP is currently 137/98 with a pulse o f62. Translation No Nurse Assessment Nurse: Cazares, RN, Sanjuanita Janie Date/Time (Eastern Time): 05/15/2019 2:12:43 PM Confirm and document reason for call. If symptomatic, describe symptoms. ---Caller states she needs to make an appt for tomorrow. Caller states she was in on Thursday for her BP and went to work on Friday and felt ill; went to the ED that afternoon and was sent home with the high BP. Caller states BP is currently 137/98 with a pulse 62. Denies fevers. Has the patient had close contact with a person known or suspected to have the novel coronavirus illness OR traveled / lives in area with major community spread (including international travel) in the last 14 days from the onset of symptoms? * If Asymptomatic, screen for exposure and travel within the last 14 days. ---No Does the patient have any new or worsening symptoms? ---Yes Will a triage be completed? ---Yes Related visit to physician within the last 2  weeks? ---Yes Does the PT have any chronic conditions? (i.e. diabetes, asthma, this includes High risk factors for pregnancy, etc.) ---Yes List chronic conditions. ---HTN. Is the patient pregnant or possibly pregnant? (Ask all females between the ages of 12-55) ---No Is this a behavioral health or substance abuse call? ---No PLEASE NOTE: All timestamps contained within this report are represented as Eastern Standard Time. CONFIDENTIALTY NOTICE: This fax transmission is intended only for the addressee. It contains information that is legally privileged, confidential or otherwise protected from use or disclosure. If you are not the intended recipient, you are strictly prohibited from reviewing, disclosing, copying using or disseminating any of this information or taking any action in reliance on or regarding this information. If you have received this fax in error, please notify us immediately by telephone so that we can arrange for its return to us. Phone: [phone], Toll-Free: [phone], Fax: [phone] Page: 2 of 2 Call Id: [id number] Guidelines Guideline Title Affirmed Question Affirmed Notes Nurse Date/Time (Eastern Time) High Blood Pressure [1] Systolic BP >= 130 OR Diastolic >= 80 AND [2] not taking BP medications Cazares, RN, Sanjuanita Janie 05/15/2019 2:12:55 PM Disp. Time (Eastern Time) Disposition Final User 05/15/2019 2:21:16 PM See PCP within 2 Weeks Yes Cazares, RN, Sanjuanita Janie Caller Disagree/Comply Comply Caller Understands Yes PreDisposition Did not know what to do Care Advice Given Per Guideline SEE PCP WITHIN 2 WEEKS: * You need to be seen for this ongoing problem within the next 2 weeks. Call your doctor (or NP/PA) during regular office hours and make an appointment. REASSURANCE AND EDUCATION: * Discuss with your doctor lifestyle modifications to help   you reduce your blood pressure. * EAT HEALTHY: Eat a diet rich in fresh fruits and vegetables,  dietary fiber, non-animal protein (e.g., soy), and low-fat dairy products. Avoid foods with a high content of saturated fat or cholesterol. * DECREASE SODIUM INTAKE: Aim to eat less than 2.4 g (100 mmol) of sodium each day. Unfortunately 75% of the salt in the average person's diet is in pre-processed foods. LIFESTYLE MODIFICATIONS - The following things can help you reduce your blood pressure: CARE ADVICE given per High Blood Pressure (Adult) guideline. * REDUCE STRESS: Find activities that help reduce your stress. Examples might include meditation, yoga, or even a restful walk in a park. CALL BACK IF: * Weakness or numbness of the face, arm or leg on one side of the body occurs * Difficulty walking, difficulty talking, or severe headache occurs * Chest pain or difficulty breathing occurs * Your blood pressure is over 160/100 * You become worse. Referrals REFERRED TO PCP OFFICE

## 2019-05-16 NOTE — Telephone Encounter (Signed)
Pt already has in office appt with DR Tower today at 10 AM.

## 2019-05-16 NOTE — Telephone Encounter (Signed)
[facility] - Client TELEPHONE ADVICE RECORD AccessNurse Patient Name: Hannah BurowAMIKA OLIVER [facility] Gender: Female DOB: 04/18/1981 Age: [age] Y 23 D Return Phone Number: [phone] (Primary), [phone] (Secondary) Address: City[place]: [place] [id number] Client [facility] Physician [place], Idamae SchullerMarne - MD Contact Type Call Who Is Calling Patient / Member / Family / Caregiver Call Type Triage / Clinical Relationship To Patient Self Return Phone Number [phone] (Primary) Chief Complaint Blood Pressure High Reason for Call Request to Schedule Office Appointment Initial Comment Caller states she needs to make an appt for tomorrow. Caller states she was in on Thursday for her BP and went to work on Friday and felt ill. Caller states she went to the ED and was sent home with the high BP. Caller states BP is currently 137/98 with a pulse o f62. Translation No Nurse Assessment Nurse: Darlin Cocoazares, RN, Charyl DancerSanjuanita Janie Date/Time (Eastern Time): 05/15/2019 2:12:43 PM Confirm and document reason for call. If symptomatic, describe symptoms. ---Caller states she needs to make an appt for tomorrow. Caller states she was in on Thursday for her BP and went to work on Friday and felt ill; went to the ED that afternoon and was sent home with the high BP. Caller states BP is currently 137/98 with a pulse 62. Denies fevers. Has the patient had close contact with a person known or suspected to have the novel coronavirus illness OR traveled / lives in area with major community spread (including international travel) in the last 14 days from the onset of symptoms? * If Asymptomatic, screen for exposure and travel within the last 14 days. ---No Does the patient have any new or worsening symptoms? ---Yes Will a triage be completed? ---Yes Related visit to physician within the last 2  weeks? ---Yes Does the PT have any chronic conditions? (i.e. diabetes, asthma, this includes High risk factors for pregnancy, etc.) ---Yes List chronic conditions. ---HTN. Is the patient pregnant or possibly pregnant? (Ask all females between the ages of 6612-55) ---No Is this a behavioral health or substance abuse call? ---No PLEASE NOTE: All timestamps contained within this report are represented as [place] Standard Time. CONFIDENTIALTY NOTICE: This fax transmission is intended only for the addressee. It contains information that is legally privileged, confidential or otherwise protected from use or disclosure. If you are not the intended recipient, you are strictly prohibited from reviewing, disclosing, copying using or disseminating any of this information or taking any action in reliance on or regarding this information. If you have received this fax in error, please notify us immediately by telephone so that we can arrange for its return to us. Phone: [phone], Toll-Free: [phone], Fax: [phone] Page: 2 of 2 Call Id: [id number] Guidelines Guideline Title Affirmed Question Affirmed Notes Nurse Date/Time Lamount Cohen(Eastern Time) High Blood Pressure [1] Systolic BP >= 130 OR Diastolic >= 80 AND [2] not taking BP medications Cazares, RN, Charyl DancerSanjuanita Janie 05/15/2019 2:12:55 PM Disp. Time Lamount Cohen(Eastern Time) Disposition Final User 05/15/2019 2:21:16 PM See PCP within 2 Weeks Yes Cazares, RN, Visual merchandiseranjuanita Janie Caller Disagree/Comply Comply Caller Understands Yes PreDisposition Did not know what to do Care Advice Given Per Guideline SEE PCP WITHIN 2 WEEKS: * You need to be seen for this ongoing problem within the next 2 weeks. Call your doctor (or NP/PA) during regular office hours and make an appointment. REASSURANCE AND EDUCATION: * Discuss with your doctor lifestyle modifications to help  you reduce your blood pressure. * EAT HEALTHY: Eat a diet rich in fresh fruits and vegetables,  dietary fiber, non-animal protein (e.g., soy), and low-fat dairy products. Avoid foods with a high content of saturated fat or cholesterol. * DECREASE SODIUM INTAKE: Aim to eat less than 2.4 g (100 mmol) of sodium each day. Unfortunately 75% of the salt in the average person's diet is in pre-processed foods. LIFESTYLE MODIFICATIONS - The following things can help you reduce your blood pressure: CARE ADVICE given per High Blood Pressure (Adult) guideline. * REDUCE STRESS: Find activities that help reduce your stress. Examples might include meditation, yoga, or even a restful walk in a park. CALL BACK IF: * Weakness or numbness of the face, arm or leg on one side of the body occurs * Difficulty walking, difficulty talking, or severe headache occurs * Chest pain or difficulty breathing occurs * Your blood pressure is over 160/100 * You become worse. Referrals REFERRED TO PCP OFFICE

## 2019-05-16 NOTE — Assessment & Plan Note (Signed)
Ongoing but improved Significant improvement after migraine cocktail in ED Reviewed hospital records, lab results and studies in detail   Disc health habits to prevent headache  Continue fluids  Flexeril prn pm /rescue  Prednisone taper to break cycle (disc side eff) Trial of gabapentin 100 mg tid (disc side eff)-if problems or change in mood inst to  Hold it and call  Will check back-can titrate up if helpful  F/u aug as planned Also continue to watch bp

## 2019-05-16 NOTE — Progress Notes (Signed)
Subjective:    Patient ID: Hannah Walton, female    DOB: 05/10/1981, 38 y.o.   MRN: [id number]  HPI Here for BP check   Was in ED for this and migraine on Friday Given 1 L of fluid Migraine cocktail- toradol and reglan and benadryl  This improved much  BP was not severely elevated EKG reassuring   Results for orders placed or performed during the hospital encounter of 05/13/19  CBC  Result Value Ref Range   WBC 6.7 4.0 - 10.5 K/uL   RBC 4.47 3.87 - 5.11 MIL/uL   Hemoglobin 12.2 12.0 - 15.0 g/dL   HCT 40.938.2 81.136.0 - 91.446.0 %   MCV 85.5 80.0 - 100.0 fL   MCH 27.3 26.0 - 34.0 pg   MCHC 31.9 30.0 - 36.0 g/dL   RDW 78.212.8 95.611.5 - 21.315.5 %   Platelets 266 150 - 400 K/uL   nRBC 0.0 0.0 - 0.2 %  Basic metabolic panel  Result Value Ref Range   Sodium 136 135 - 145 mmol/L   Potassium 3.7 3.5 - 5.1 mmol/L   Chloride 105 98 - 111 mmol/L   CO2 20 (L) 22 - 32 mmol/L   Glucose, Bld 87 70 - 99 mg/dL   BUN 8 6 - 20 mg/dL   Creatinine, Ser 0.860.91 0.44 - 1.00 mg/dL   Calcium 9.0 8.9 - 57.810.3 mg/dL   GFR calc non Af Amer >60 >60 mL/min   GFR calc Af Amer >60 >60 mL/min   Anion gap 11 5 - 15  Urinalysis, Routine w reflex microscopic  Result Value Ref Range   Color, Urine YELLOW YELLOW   APPearance CLEAR CLEAR   Specific Gravity, Urine 1.020 1.005 - 1.030   pH 5.0 5.0 - 8.0   Glucose, UA NEGATIVE NEGATIVE mg/dL   Hgb urine dipstick SMALL (A) NEGATIVE   Bilirubin Urine NEGATIVE NEGATIVE   Ketones, ur NEGATIVE NEGATIVE mg/dL   Protein, ur NEGATIVE NEGATIVE mg/dL   Nitrite NEGATIVE NEGATIVE   Leukocytes,Ua NEGATIVE NEGATIVE   RBC / HPF 0-5 0 - 5 RBC/hpf   WBC, UA 0-5 0 - 5 WBC/hpf   Bacteria, UA NONE SEEN NONE SEEN   Squamous Epithelial / LPF 0-5 0 - 5   Mucus PRESENT   I-Stat beta hCG blood, ED  Result Value Ref Range   I-stat hCG, quantitative <5.0 <5 mIU/mL   Comment 3            BP Readings from Last 3 Encounters:  05/16/19 (!) 146/84  05/13/19 (!) 142/87  05/12/19 130/88    Wt Readings from Last 3 Encounters:  05/16/19 198 lb 4 oz (89.9 kg)  05/13/19 195 lb (88.5 kg)  05/12/19 197 lb 3 oz (89.4 kg)   35.12 kg/m   BP at work has been very high  At home her bp 130s-140s /90s   Pulse Readings from Last 3 Encounters:  05/16/19 68  05/13/19 (!) 57  05/12/19 66   Today she still has a headache  Tylenol helps a bit  Mid forehead  Using flonase  Flexeril- took it last night- helped her sleep  Off the OC right now   Patient Active Problem List   Diagnosis Date Noted  . Elevated BP without diagnosis of hypertension 05/12/2019  . Headache 05/12/2019  . Spasm of back muscles 12/06/2018  . Paresthesia of left arm 05/11/2017  . Amenorrhea due to oral contraceptive 11/26/2015  . Encounter for routine gynecological examination 05/16/2015  .  Routine general medical examination at a health care facility 05/08/2015  . Chronic constipation 03/07/2014   Past Medical History:  Diagnosis Date  . Cyst of knee joint 10/2014   left  . Cyst of left knee joint 10/27/2014  . History of gastric ulcer   . Sinus headache   . Synovitis of knee 10/2014   left   Past Surgical History:  Procedure Laterality Date  . KNEE ARTHROSCOPY Left 10/27/2014   Procedure: LEFT KNEE SCOPE SYNOVECTOMY;  Surgeon: Johnny Bridge, MD;  Location: [facility];  Service: Orthopedics;  Laterality: Left;  Marland Kitchen MASS EXCISION Left 10/27/2014   Procedure: LEFT PCL CYST EXCISION ;  Surgeon: Johnny Bridge, MD;  Location: [facility];  Service: Orthopedics;  Laterality: Left;  . UPPER GI ENDOSCOPY     Social History   Tobacco Use  . Smoking status: Never Smoker  . Smokeless tobacco: Never Used  Substance Use Topics  . Alcohol use: No    Alcohol/week: 0.0 standard drinks  . Drug use: No   Family History  Problem Relation Age of Onset  . Arthritis Mother   . Diabetes Maternal Grandmother   . Arthritis Paternal Grandmother   . Stroke Father   . Stroke Paternal  Uncle   . Diabetes Paternal Uncle    Allergies  Allergen Reactions  . Yasmin [Drospirenone-Ethinyl Estradiol] Nausea Only  . Asa [Aspirin] Other (See Comments)    Remote history of gastric ulcer. Not true allergy to this medication.  Jefm Petty, MD    . Ibuprofen Other (See Comments)    Remote hx of gastric ulcer Jefm Petty, MD   Current Outpatient Medications on File Prior to Visit  Medication Sig Dispense Refill  . cyclobenzaprine (FLEXERIL) 10 MG tablet Take 1 tablet (10 mg total) by mouth 3 (three) times daily as needed for muscle spasms. When not working or driving 30 tablet 1  . norethindrone-ethinyl estradiol (JUNEL 1/20) 1-20 MG-MCG tablet Take 1 tablet by mouth daily. 1 Package 11   No current facility-administered medications on file prior to visit.     Review of Systems  Constitutional: Negative for activity change, appetite change, fatigue, fever and unexpected weight change.  HENT: Negative for congestion, ear pain, rhinorrhea, sinus pressure and sore throat.   Eyes: Negative for pain, redness and visual disturbance.  Respiratory: Negative for cough, shortness of breath and wheezing.   Cardiovascular: Negative for chest pain and palpitations.  Gastrointestinal: Negative for abdominal pain, blood in stool, constipation and diarrhea.  Endocrine: Negative for polydipsia and polyuria.  Genitourinary: Negative for dysuria, frequency and urgency.  Musculoskeletal: Negative for arthralgias, back pain and myalgias.  Skin: Negative for pallor and rash.  Allergic/Immunologic: Negative for environmental allergies.  Neurological: Positive for headaches. Negative for dizziness, tremors, seizures, syncope, facial asymmetry, speech difficulty, weakness, light-headedness and numbness.  Hematological: Negative for adenopathy. Does not bruise/bleed easily.  Psychiatric/Behavioral: Negative for decreased concentration and dysphoric mood. The patient is not nervous/anxious.         Objective:   Physical Exam Constitutional:      General: She is not in acute distress.    Appearance: She is well-developed. She is obese. She is not ill-appearing or diaphoretic.  HENT:     Head: Normocephalic and atraumatic.     Right Ear: External ear normal.     Left Ear: External ear normal.     Nose: Nose normal.     Mouth/Throat:  Mouth: Mucous membranes are moist.     Pharynx: No oropharyngeal exudate.  Eyes:     General: No scleral icterus.       Right eye: No discharge.        Left eye: No discharge.     Conjunctiva/sclera: Conjunctivae normal.     Pupils: Pupils are equal, round, and reactive to light.     Comments: No nystagmus  Neck:     Musculoskeletal: Full passive range of motion without pain, normal range of motion and neck supple.     Thyroid: No thyromegaly.     Vascular: No carotid bruit or JVD.     Trachea: No tracheal deviation.  Cardiovascular:     Rate and Rhythm: Normal rate and regular rhythm.     Heart sounds: Normal heart sounds. No murmur.  Pulmonary:     Effort: Pulmonary effort is normal. No respiratory distress.     Breath sounds: Normal breath sounds. No wheezing or rales.  Abdominal:     General: Bowel sounds are normal. There is no distension.     Palpations: Abdomen is soft. There is no mass.     Tenderness: There is no abdominal tenderness.  Musculoskeletal:        General: No tenderness.     Right lower leg: No edema.     Left lower leg: No edema.  Lymphadenopathy:     Cervical: No cervical adenopathy.  Skin:    General: Skin is warm and dry.     Coloration: Skin is not pale.     Findings: No rash.  Neurological:     Mental Status: She is alert and oriented to person, place, and time.     Cranial Nerves: No cranial nerve deficit.     Sensory: No sensory deficit.     Motor: No weakness, tremor, atrophy or abnormal muscle tone.     Coordination: Coordination normal.     Gait: Gait normal.     Deep Tendon Reflexes:  Reflexes are normal and symmetric. Reflexes normal.     Comments: No focal cerebellar signs   Psychiatric:        Mood and Affect: Mood normal.        Behavior: Behavior normal.        Thought Content: Thought content normal.           Assessment & Plan:   Problem List Items Addressed This Visit      Other   Elevated BP without diagnosis of hypertension    bp is trending down - suspect the cuff used at work is inaccurate Suspect headache makes bp high as opposed to vice versa  Enc good health habits BP: (!) 146/84 currently off OC bp was reassuring in ED Reviewed hospital records, lab results and studies in detail     Continue to monitor  Working on headache relief       Headache - Primary    Ongoing but improved Significant improvement after migraine cocktail in ED Reviewed hospital records, lab results and studies in detail   Disc health habits to prevent headache  Continue fluids  Flexeril prn pm /rescue  Prednisone taper to break cycle (disc side eff) Trial of gabapentin 100 mg tid (disc side eff)-if problems or change in mood inst to  Hold it and call  Will check back-can titrate up if helpful  F/u aug as planned Also continue to watch bp       Relevant Medications  gabapentin (NEURONTIN) 100 MG capsule

## 2019-05-16 NOTE — Patient Instructions (Addendum)
I don't think the BP cuff at work is accurate  Try using yours when relaxed and cross arm over chest for reading   Take prednisone course to help break the headache cycle Take gabapentin 100 mg three times daily  Am/ lunch/ bedtime   We can gradually titrate dose up as needed  If any intolerable side effects let me know and hold it   Stay very hydrated  Avoid caffeine   Take care of yourself  Flexeril is ok at night

## 2019-05-17 IMAGING — DX DG CERVICAL SPINE COMPLETE 4+V
6 series · 6 of 6 positions shown · non-contrast
Comparison: None.

CLINICAL DATA: Two-month history of neck pain radiating into the
left arm.

EXAM:
CERVICAL SPINE - COMPLETE 4+ VIEW

[cervical spine ap]
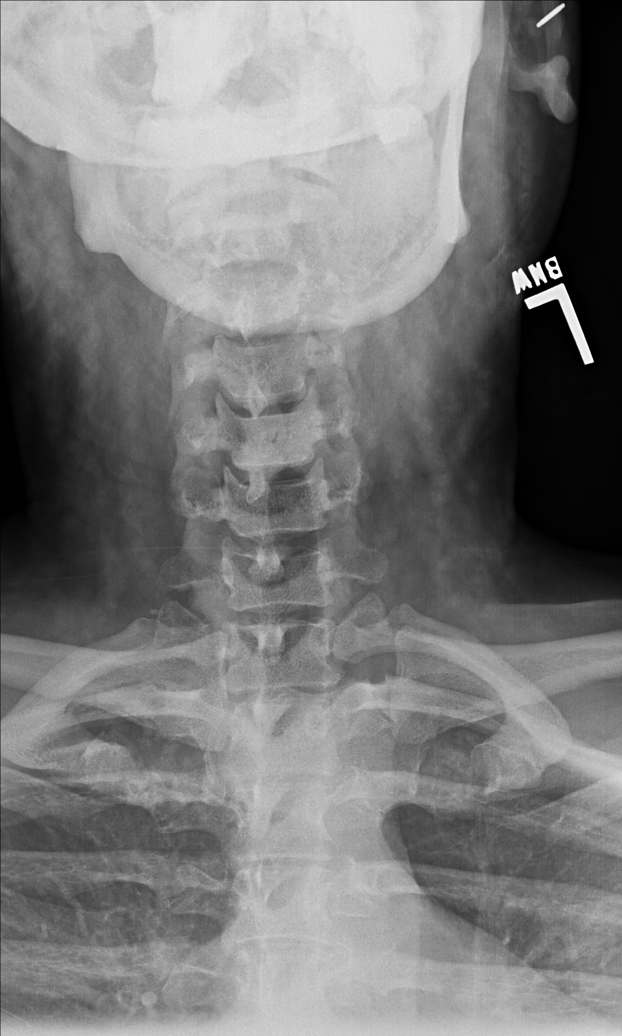

[cervical spine oblique (1 of 2)]
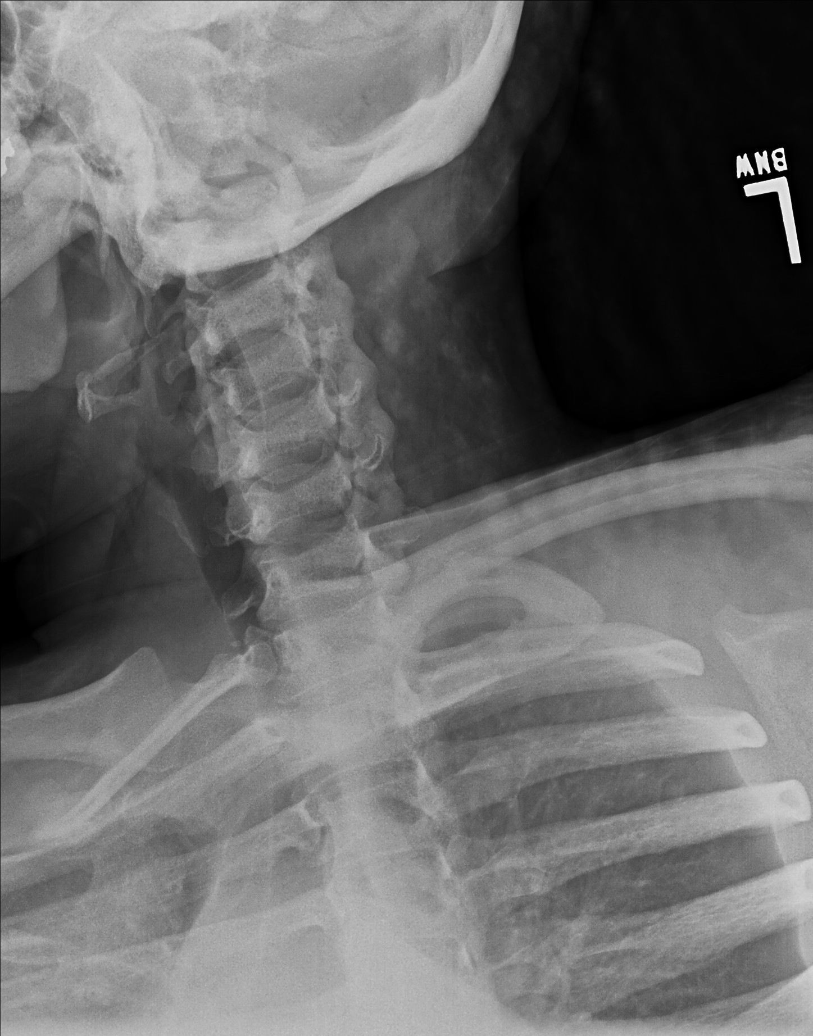

[cervical spine oblique (2 of 2)]
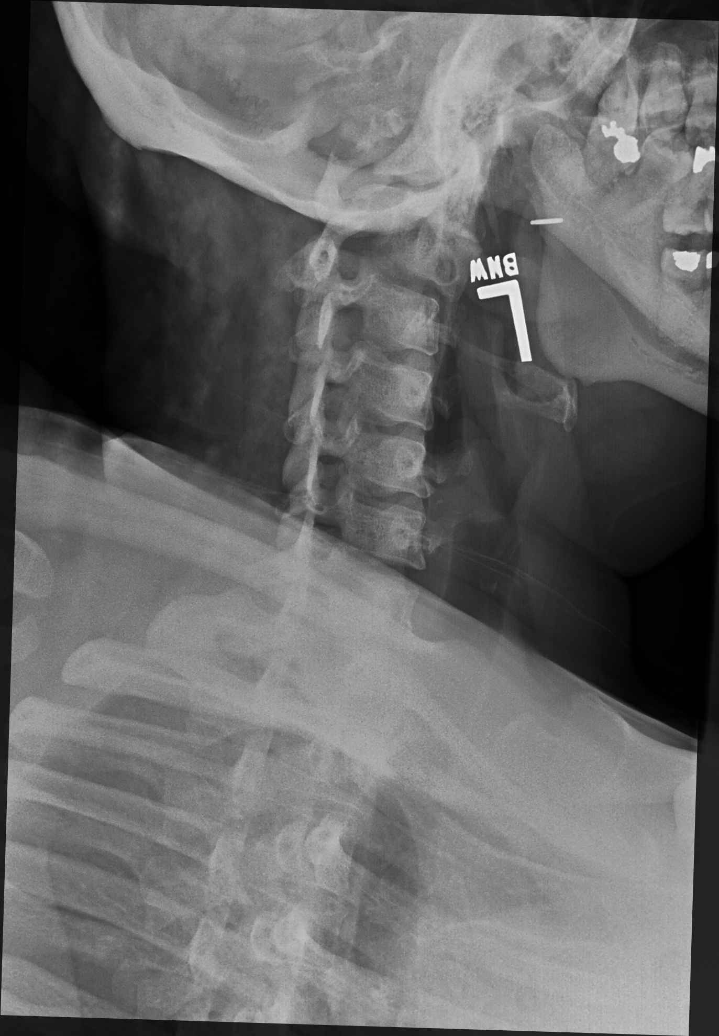

[cervical spine lat]
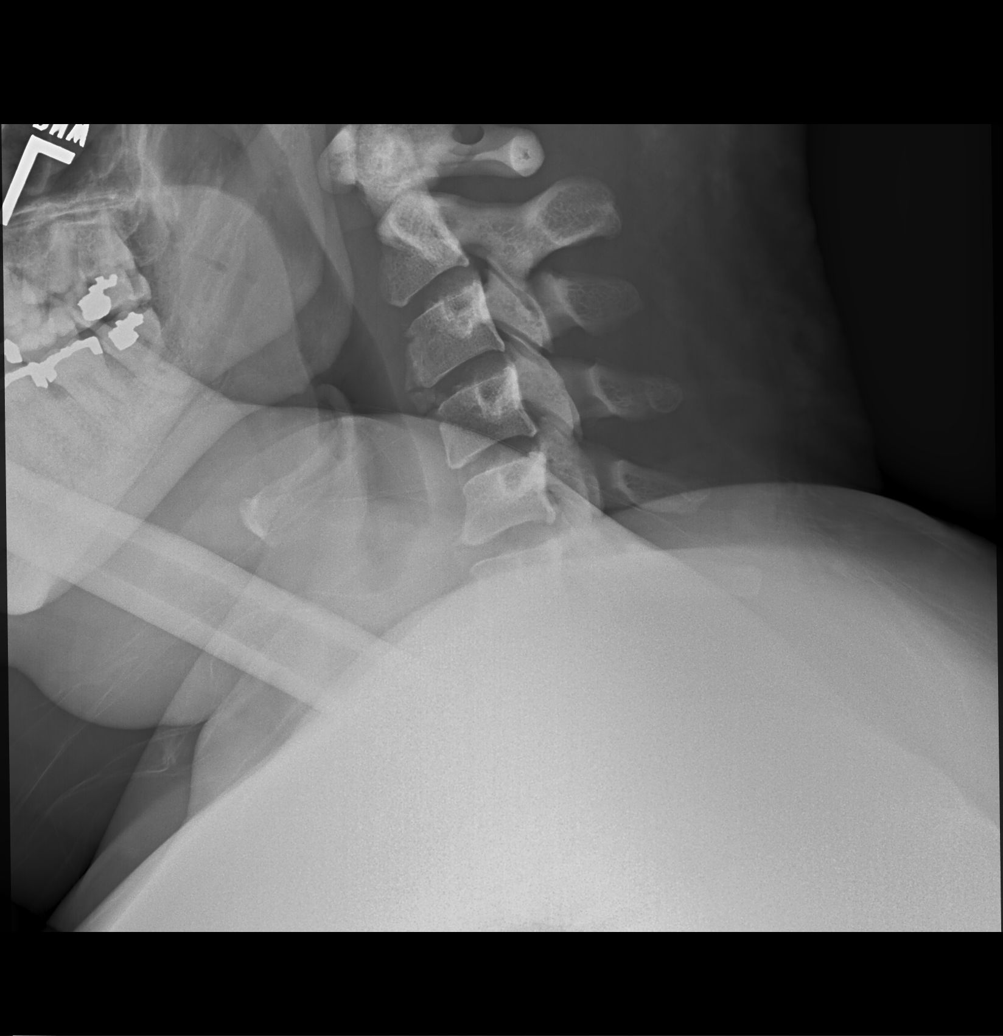

[swimmers lat]
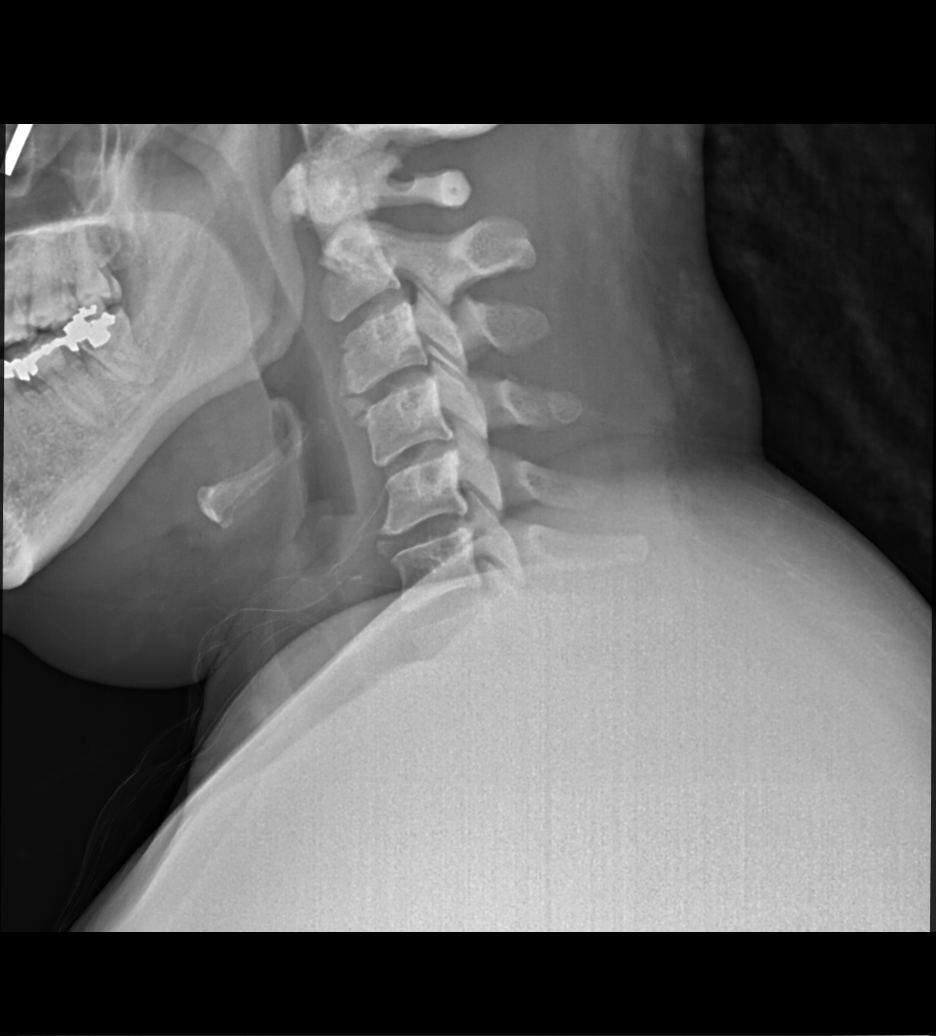

[cervical spine open mouth ap]
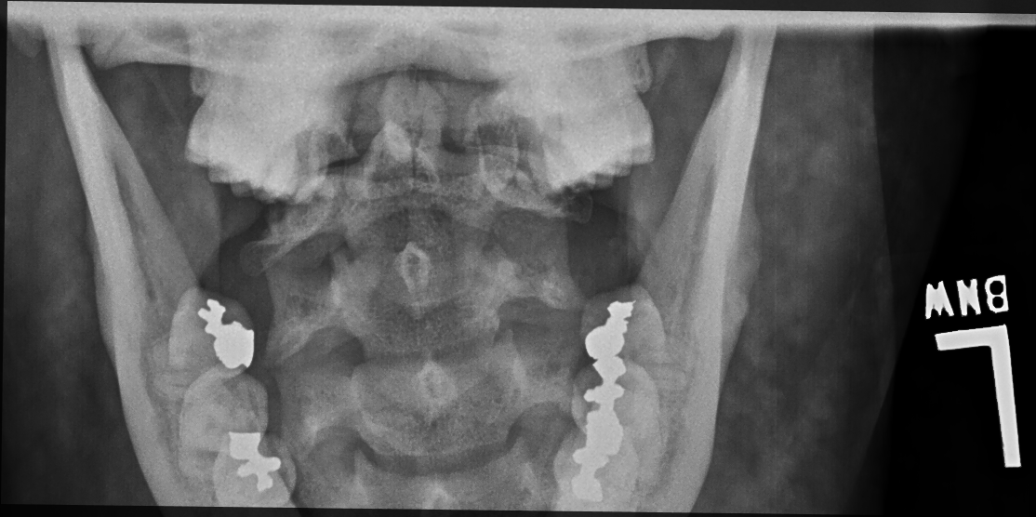

[6 of 6 positions shown; findings below may reference images not displayed]

FINDINGS: Due to body habitus, I am only able to visualize through the mid C7
level on the lateral and swimmer's images. Anatomic alignment
through mid C7. Straightening of the usual lordosis. No visible
fractures. Mild disc space narrowing at C4-5. Ossification in the
anterior annular fibers at C3-4 and C5-6. The right oblique view is
suboptimal as the neural foramina are not visualized. The left
neural foramina appear widely patent. No static evidence of
instability.
IMPRESSION: 1. Straightening of the usual lordosis which may reflect positioning
and/or spasm.
2. Mild degenerative disc disease at C4-5.
3. Calcification/ossification in the anterior annular fibers at C3-4
and C5-6 of doubtful clinical significance.
If symptoms do not improve with conservative management, MRI of the
cervical spine without contrast may be helpful in further
evaluation.

## 2019-05-27 ENCOUNTER — Telehealth: Payer: Self-pay | Admitting: Family Medicine

## 2019-05-27 NOTE — Telephone Encounter (Signed)
I would like her to continue it  Please send in refills if needed How is the headache?

## 2019-05-27 NOTE — Telephone Encounter (Signed)
Patient called today and would like to know if she needs to continue the Gabapentin until she runs out or if she needs to stop now that she has finished the Prednisone.     Patient's C/B #  [phone]

## 2019-05-27 NOTE — Telephone Encounter (Signed)
Called pt and no answer and pt's VM box was full 

## 2019-05-31 NOTE — Telephone Encounter (Signed)
Pt notified of Dr. Marliss Coots comments. Pt has refills on file so she doesn't need [place] to refill med. Pt said HA are a lot better she still gets them every now and then but they are not as frequent as before and not as severe. Pt said she has been taking gabapentin BID since HA are a little better.   Pt did want me to ask Dr. Glori Bickers a question. Pt has her f/u for HA and BP on 06/13/19 and then her CPE is schedule 06/28/19, pt doesn't see a point in coming in twice so closely so she wanted to see if both appts were necessary or could she only do one and address everything

## 2019-05-31 NOTE — Telephone Encounter (Signed)
Best number [phone] Pt returned your call

## 2019-05-31 NOTE — Telephone Encounter (Signed)
Cancel the headache f/u and keep the PE  Glad she is doing better

## 2019-05-31 NOTE — Telephone Encounter (Signed)
Called pt again and no answer and pt's VM box is still full

## 2019-05-31 NOTE — Telephone Encounter (Signed)
Pt notified and f/u appt cancelled and pt will keep her CPE

## 2019-06-13 ENCOUNTER — Ambulatory Visit: Payer: BC Managed Care – PPO | Admitting: Family Medicine

## 2019-06-21 ENCOUNTER — Encounter: Payer: BLUE CROSS/BLUE SHIELD | Admitting: Family Medicine

## 2019-06-28 ENCOUNTER — Ambulatory Visit (INDEPENDENT_AMBULATORY_CARE_PROVIDER_SITE_OTHER): Payer: BC Managed Care – PPO | Admitting: Family Medicine

## 2019-06-28 ENCOUNTER — Other Ambulatory Visit: Payer: Self-pay

## 2019-06-28 ENCOUNTER — Encounter: Payer: Self-pay | Admitting: Family Medicine

## 2019-06-28 VITALS — BP 126/88 | HR 88 | Temp 98.0°F | Ht 63.0 in | Wt 197.1 lb

## 2019-06-28 DIAGNOSIS — G4452 New daily persistent headache (NDPH): Secondary | ICD-10-CM

## 2019-06-28 DIAGNOSIS — R030 Elevated blood-pressure reading, without diagnosis of hypertension: Secondary | ICD-10-CM | POA: Diagnosis not present

## 2019-06-28 DIAGNOSIS — Z0000 Encounter for general adult medical examination without abnormal findings: Secondary | ICD-10-CM | POA: Diagnosis not present

## 2019-06-28 LAB — CBC WITH DIFFERENTIAL/PLATELET
Basophils Absolute: 0 10*3/uL (ref 0.0–0.1)
Basophils Relative: 0.3 % (ref 0.0–3.0)
Eosinophils Absolute: 0 10*3/uL (ref 0.0–0.7)
Eosinophils Relative: 0.6 % (ref 0.0–5.0)
HCT: 38.8 % (ref 36.0–46.0)
Hemoglobin: 12.5 g/dL (ref 12.0–15.0)
Lymphocytes Relative: 45.4 % (ref 12.0–46.0)
Lymphs Abs: 2.6 10*3/uL (ref 0.7–4.0)
MCHC: 32.2 g/dL (ref 30.0–36.0)
MCV: 84 fl (ref 78.0–100.0)
Monocytes Absolute: 0.5 10*3/uL (ref 0.1–1.0)
Monocytes Relative: 9 % (ref 3.0–12.0)
Neutro Abs: 2.6 10*3/uL (ref 1.4–7.7)
Neutrophils Relative %: 44.7 % (ref 43.0–77.0)
Platelets: 276 10*3/uL (ref 150.0–400.0)
RBC: 4.62 Mil/uL (ref 3.87–5.11)
RDW: 13.5 % (ref 11.5–15.5)
WBC: 5.7 10*3/uL (ref 4.0–10.5)

## 2019-06-28 LAB — COMPREHENSIVE METABOLIC PANEL
ALT: 33 U/L (ref 0–35)
AST: 26 U/L (ref 0–37)
Albumin: 4.2 g/dL (ref 3.5–5.2)
Alkaline Phosphatase: 80 U/L (ref 39–117)
BUN: 14 mg/dL (ref 6–23)
CO2: 26 mEq/L (ref 19–32)
Calcium: 9.2 mg/dL (ref 8.4–10.5)
Chloride: 103 mEq/L (ref 96–112)
Creatinine, Ser: 0.72 mg/dL (ref 0.40–1.20)
GFR: 109.01 mL/min (ref >60.00)
Glucose, Bld: 92 mg/dL (ref 70–99)
Potassium: 4.7 mEq/L (ref 3.5–5.1)
Sodium: 136 mEq/L (ref 135–145)
Total Bilirubin: 0.5 mg/dL (ref 0.2–1.2)
Total Protein: 7 g/dL (ref 6.0–8.3)

## 2019-06-28 LAB — LIPID PANEL
Cholesterol: 190 mg/dL (ref 0–200)
HDL: 80.7 mg/dL (ref >39.00)
LDL Cholesterol: 97 mg/dL (ref 0–99)
NonHDL: 109.07
Total CHOL/HDL Ratio: 2
Triglycerides: 59 mg/dL (ref 0.0–149.0)
VLDL: 11.8 mg/dL (ref 0.0–40.0)

## 2019-06-28 LAB — TSH: TSH: 0.59 u[IU]/mL (ref 0.35–4.50)

## 2019-06-28 MED ORDER — GABAPENTIN 100 MG PO CAPS: ORAL_CAPSULE | ORAL | 11 refills | Status: DC

## 2019-06-28 MED ORDER — NORETHINDRONE ACET-ETHINYL EST 1-20 MG-MCG PO TABS: 1.0000 | ORAL_TABLET | Freq: Every day | ORAL | 11 refills | Status: DC

## 2019-06-28 NOTE — Assessment & Plan Note (Signed)
A little improvement on gabapentin  Stopping OC did not make a difference Will inc gabapentin to 200 mg bid and re assess in about a month Good lifestyle habits  inst to alert [place] if headaches worsen

## 2019-06-28 NOTE — Patient Instructions (Addendum)
Get back on the oral contraceptive  If you see blood pressure go up- then stop it and let me know  BP is very good today   Increase gabapentin to 200 mg twice daily  In a month if headaches have not improved let me know   Think about getting a flu shot please   Drink lots of fluids  Keep exercising Labs today

## 2019-06-28 NOTE — Assessment & Plan Note (Signed)
Improved today (currently off OC)  Tends to go up when she has a HA Unsure if hormones play a role  Will re start OC and check at home inst to call if bp goes back up on OC

## 2019-06-28 NOTE — Assessment & Plan Note (Signed)
Reviewed health habits including diet and exercise and skin cancer prevention Reviewed appropriate screening tests for age  Also reviewed health mt list, fam hx and immunization status , as well as social and family history   See HPI Labs ordered for wellness utd pap  Plans to re start OC  bp is improved  Declines flu shot-enc to re consider  Will need mammograms starting at 39 yo Enc her to keep exercising and eating better Enc wt loss

## 2019-06-28 NOTE — Progress Notes (Signed)
Subjective:    Patient ID: Hannah Walton, female    DOB: Mar 19, 1981, 38 y.o.   MRN: [id number]  HPI  Here for health maintenance exam and to review chronic medical problems    Wt Readings from Last 3 Encounters:  06/28/19 197 lb 1 oz (89.4 kg)  05/16/19 198 lb 4 oz (89.9 kg)  05/13/19 195 lb (88.5 kg)  walking for exercise  Eating better most of the time  34.91 kg/m   Baking food instead of frying   Declines flu shot   Pap 8/19 -negative and neg hpv Menses Was on junel- stopped it and had a period last week  Is on it for period control  Never had a period on the pill (US pelvic was ok)    Pt will turn 39 in a year   PHQ: 0  Tdap 6/15  BP Readings from Last 3 Encounters:  06/28/19 126/88  05/16/19 (!) 146/84  05/13/19 (!) 142/87  yesterday bp was 140 (had a headache then)  Uses a wrist cuff  Other times below 140    Pulse Readings from Last 3 Encounters:  06/28/19 88  05/16/19 68  05/13/19 (!) 57   Headaches -has them off and on  Less frequent than they were (did change diet as well)   Taking gabapentin - 100 mg twice daily  (hard to remember the mid day dose)  Naproxen as needed Also flonase for allergies  Flexeril helps worse headaches  Still has about 3 headaches per week   Exercising regularly  Drinking lots of fluids   Patient Active Problem List   Diagnosis Date Noted  . Elevated BP without diagnosis of hypertension 05/12/2019  . Headache 05/12/2019  . Spasm of back muscles 12/06/2018  . Paresthesia of left arm 05/11/2017  . Amenorrhea due to oral contraceptive 11/26/2015  . Encounter for routine gynecological examination 05/16/2015  . Routine general medical examination at a health care facility 05/08/2015  . Chronic constipation 03/07/2014   Past Medical History:  Diagnosis Date  . Cyst of knee joint 10/2014   left  . Cyst of left knee joint 10/27/2014  . History of gastric ulcer   . Sinus headache   . Synovitis of knee  10/2014   left   Past Surgical History:  Procedure Laterality Date  . KNEE ARTHROSCOPY Left 10/27/2014   Procedure: LEFT KNEE SCOPE SYNOVECTOMY;  Surgeon: Johnny Bridge, MD;  Location: [facility];  Service: Orthopedics;  Laterality: Left;  Marland Kitchen MASS EXCISION Left 10/27/2014   Procedure: LEFT PCL CYST EXCISION ;  Surgeon: Johnny Bridge, MD;  Location: [facility];  Service: Orthopedics;  Laterality: Left;  . UPPER GI ENDOSCOPY     Social History   Tobacco Use  . Smoking status: Never Smoker  . Smokeless tobacco: Never Used  Substance Use Topics  . Alcohol use: No    Alcohol/week: 0.0 standard drinks  . Drug use: No   Family History  Problem Relation Age of Onset  . Arthritis Mother   . Diabetes Maternal Grandmother   . Arthritis Paternal Grandmother   . Stroke Father   . Stroke Paternal Uncle   . Diabetes Paternal Uncle    Allergies  Allergen Reactions  . Yasmin [Drospirenone-Ethinyl Estradiol] Nausea Only  . Asa [Aspirin] Other (See Comments)    Remote history of gastric ulcer. Not true allergy to this medication.  Jefm Petty, MD    . Ibuprofen Other (See  Comments)    Remote hx of gastric ulcer Saverio Dankerrew A Williams, MD   Current Outpatient Medications on File Prior to Visit  Medication Sig Dispense Refill  . cyclobenzaprine (FLEXERIL) 10 MG tablet Take 1 tablet (10 mg total) by mouth 3 (three) times daily as needed for muscle spasms. When not working or driving 30 tablet 1   No current facility-administered medications on file prior to visit.     Review of Systems  Constitutional: Negative for activity change, appetite change, fatigue, fever and unexpected weight change.  HENT: Negative for congestion, ear pain, rhinorrhea, sinus pressure and sore throat.   Eyes: Negative for pain, redness and visual disturbance.  Respiratory: Negative for cough, shortness of breath and wheezing.   Cardiovascular: Negative for chest pain and palpitations.   Gastrointestinal: Negative for abdominal pain, blood in stool, constipation and diarrhea.  Endocrine: Negative for polydipsia and polyuria.  Genitourinary: Negative for dysuria, frequency and urgency.  Musculoskeletal: Negative for arthralgias, back pain and myalgias.  Skin: Negative for pallor and rash.  Allergic/Immunologic: Negative for environmental allergies.  Neurological: Positive for headaches. Negative for dizziness, tremors, seizures, syncope, facial asymmetry, speech difficulty, weakness, light-headedness and numbness.  Hematological: Negative for adenopathy. Does not bruise/bleed easily.  Psychiatric/Behavioral: Negative for decreased concentration and dysphoric mood. The patient is not nervous/anxious.        Objective:   Physical Exam Constitutional:      General: She is not in acute distress.    Appearance: Normal appearance. She is well-developed. She is obese. She is not ill-appearing or diaphoretic.  HENT:     Head: Normocephalic and atraumatic.     Right Ear: Tympanic membrane, ear canal and external ear normal.     Left Ear: Tympanic membrane, ear canal and external ear normal.     Nose: Nose normal. No congestion.     Mouth/Throat:     Mouth: Mucous membranes are moist.     Pharynx: Oropharynx is clear. No posterior oropharyngeal erythema.  Eyes:     General: No scleral icterus.    Extraocular Movements: Extraocular movements intact.     Conjunctiva/sclera: Conjunctivae normal.     Pupils: Pupils are equal, round, and reactive to light.  Neck:     Musculoskeletal: Normal range of motion and neck supple. No neck rigidity or muscular tenderness.     Thyroid: No thyromegaly.     Vascular: No carotid bruit or JVD.  Cardiovascular:     Rate and Rhythm: Normal rate and regular rhythm.     Pulses: Normal pulses.     Heart sounds: Normal heart sounds. No gallop.   Pulmonary:     Effort: Pulmonary effort is normal. No respiratory distress.     Breath sounds:  Normal breath sounds. No wheezing.     Comments: Good air exch Chest:     Chest wall: No tenderness.  Abdominal:     General: Bowel sounds are normal. There is no distension or abdominal bruit.     Palpations: Abdomen is soft. There is no mass.     Tenderness: There is no abdominal tenderness.     Hernia: No hernia is present.  Genitourinary:    Comments: Breast exam: No mass, nodules, thickening, tenderness, bulging, retraction, inflamation, nipple discharge or skin changes noted.  No axillary or clavicular LA.     Musculoskeletal: Normal range of motion.        General: No tenderness.     Right lower leg: No edema.  Left lower leg: No edema.     Comments: No acute joint changes  Lymphadenopathy:     Cervical: No cervical adenopathy.  Skin:    General: Skin is warm and dry.     Coloration: Skin is not pale.     Findings: No erythema or rash.     Comments: Few skin tags   Neurological:     Mental Status: She is alert. Mental status is at baseline.     Cranial Nerves: No cranial nerve deficit.     Motor: No abnormal muscle tone.     Coordination: Coordination normal.     Gait: Gait normal.     Deep Tendon Reflexes: Reflexes are normal and symmetric. Reflexes normal.  Psychiatric:        Mood and Affect: Mood normal.           Assessment & Plan:   Problem List Items Addressed This Visit      Other   Routine general medical examination at a health care facility - Primary    Reviewed health habits including diet and exercise and skin cancer prevention Reviewed appropriate screening tests for age  Also reviewed health mt list, fam hx and immunization status , as well as social and family history   See HPI Labs ordered for wellness utd pap  Plans to re start OC  bp is improved  Declines flu shot-enc to re consider  Will need mammograms starting at 38 yo Enc her to keep exercising and eating better Enc wt loss       Relevant Orders   CBC with  Differential/Platelet (Completed)   Comprehensive metabolic panel (Completed)   Lipid panel (Completed)   TSH (Completed)   Elevated BP without diagnosis of hypertension    Improved today (currently off OC)  Tends to go up when she has a HA Unsure if hormones play a role  Will re start OC and check at home inst to call if bp goes back up on OC       Headache    A little improvement on gabapentin  Stopping OC did not make a difference Will inc gabapentin to 200 mg bid and re assess in about a month Good lifestyle habits  inst to alert [place] if headaches worsen      Relevant Medications   gabapentin (NEURONTIN) 100 MG capsule

## 2019-06-29 ENCOUNTER — Encounter: Payer: Self-pay | Admitting: *Deleted

## 2019-07-11 ENCOUNTER — Other Ambulatory Visit: Payer: Self-pay

## 2019-07-11 DIAGNOSIS — Z20822 Contact with and (suspected) exposure to covid-19: Secondary | ICD-10-CM

## 2019-07-13 LAB — NOVEL CORONAVIRUS, NAA: SARS-CoV-2, NAA: NOT DETECTED

## 2019-07-14 ENCOUNTER — Telehealth: Payer: Self-pay | Admitting: Family Medicine

## 2019-07-14 MED ORDER — PREDNISONE 10 MG PO TABS: ORAL_TABLET | ORAL | 0 refills | Status: DC

## 2019-07-14 NOTE — Telephone Encounter (Signed)
Patient called. She said she's still having headaches.  Patient said all week her headaches have been on and off. Patient said the pain is over her eyes.  Patient wants to know if she can get another rx for Prednisone.  Patient uses [facility].

## 2019-07-14 NOTE — Telephone Encounter (Signed)
I sent it  Hope this weill break the cycle  Let me know if it does not  We may need to advance gabapentin again  Drink lots of fluids

## 2019-07-14 NOTE — Telephone Encounter (Signed)
Pt notified of Dr. Tower's comments and verbalized understanding  

## 2019-09-22 DIAGNOSIS — Z119 Encounter for screening for infectious and parasitic diseases, unspecified: Secondary | ICD-10-CM | POA: Diagnosis not present

## 2019-11-10 DIAGNOSIS — Z20828 Contact with and (suspected) exposure to other viral communicable diseases: Secondary | ICD-10-CM | POA: Diagnosis not present

## 2019-11-10 DIAGNOSIS — Z1159 Encounter for screening for other viral diseases: Secondary | ICD-10-CM | POA: Diagnosis not present

## 2019-11-17 DIAGNOSIS — Z20828 Contact with and (suspected) exposure to other viral communicable diseases: Secondary | ICD-10-CM | POA: Diagnosis not present

## 2019-11-17 DIAGNOSIS — Z1159 Encounter for screening for other viral diseases: Secondary | ICD-10-CM | POA: Diagnosis not present

## 2019-11-24 DIAGNOSIS — Z1159 Encounter for screening for other viral diseases: Secondary | ICD-10-CM | POA: Diagnosis not present

## 2019-11-24 DIAGNOSIS — Z20828 Contact with and (suspected) exposure to other viral communicable diseases: Secondary | ICD-10-CM | POA: Diagnosis not present

## 2019-12-01 DIAGNOSIS — Z1159 Encounter for screening for other viral diseases: Secondary | ICD-10-CM | POA: Diagnosis not present

## 2019-12-01 DIAGNOSIS — Z20828 Contact with and (suspected) exposure to other viral communicable diseases: Secondary | ICD-10-CM | POA: Diagnosis not present

## 2019-12-02 ENCOUNTER — Telehealth: Payer: Self-pay | Admitting: Family Medicine

## 2019-12-02 NOTE — Telephone Encounter (Signed)
She needs a visit-please set up for virtual for Saturday clinic

## 2019-12-02 NOTE — Telephone Encounter (Signed)
I spoke to patient and she said she didn't want to schedule an appointment for Saturday clinic.  She said she's going to try ginger and honey over the weekend and if she's not feeling better, she'll call on Monday to schedule an appointment with Dr.Tower.

## 2019-12-02 NOTE — Telephone Encounter (Signed)
Patient called today and stated she believes she has a sinus infection She said her eyes are hurting and right in between her eyes she is having pain. Tried to schedule a virtual visit with patient but she wanted a message sent back first,. Patient stated she has gabapentin and also has 5 more tablets of prednisone that she was previously prescribed before for sinus infection. She wanted to know if she could take that.

## 2019-12-08 DIAGNOSIS — Z1159 Encounter for screening for other viral diseases: Secondary | ICD-10-CM | POA: Diagnosis not present

## 2019-12-08 DIAGNOSIS — Z20828 Contact with and (suspected) exposure to other viral communicable diseases: Secondary | ICD-10-CM | POA: Diagnosis not present

## 2019-12-15 DIAGNOSIS — Z1159 Encounter for screening for other viral diseases: Secondary | ICD-10-CM | POA: Diagnosis not present

## 2019-12-15 DIAGNOSIS — Z20828 Contact with and (suspected) exposure to other viral communicable diseases: Secondary | ICD-10-CM | POA: Diagnosis not present

## 2019-12-22 DIAGNOSIS — Z1159 Encounter for screening for other viral diseases: Secondary | ICD-10-CM | POA: Diagnosis not present

## 2019-12-22 DIAGNOSIS — Z20828 Contact with and (suspected) exposure to other viral communicable diseases: Secondary | ICD-10-CM | POA: Diagnosis not present

## 2019-12-29 DIAGNOSIS — Z20828 Contact with and (suspected) exposure to other viral communicable diseases: Secondary | ICD-10-CM | POA: Diagnosis not present

## 2019-12-29 DIAGNOSIS — Z1159 Encounter for screening for other viral diseases: Secondary | ICD-10-CM | POA: Diagnosis not present

## 2020-01-05 DIAGNOSIS — Z20828 Contact with and (suspected) exposure to other viral communicable diseases: Secondary | ICD-10-CM | POA: Diagnosis not present

## 2020-01-05 DIAGNOSIS — Z1159 Encounter for screening for other viral diseases: Secondary | ICD-10-CM | POA: Diagnosis not present

## 2020-01-12 DIAGNOSIS — Z20828 Contact with and (suspected) exposure to other viral communicable diseases: Secondary | ICD-10-CM | POA: Diagnosis not present

## 2020-01-12 DIAGNOSIS — Z1159 Encounter for screening for other viral diseases: Secondary | ICD-10-CM | POA: Diagnosis not present

## 2020-01-19 DIAGNOSIS — Z1159 Encounter for screening for other viral diseases: Secondary | ICD-10-CM | POA: Diagnosis not present

## 2020-01-19 DIAGNOSIS — Z20828 Contact with and (suspected) exposure to other viral communicable diseases: Secondary | ICD-10-CM | POA: Diagnosis not present

## 2020-01-26 DIAGNOSIS — Z20828 Contact with and (suspected) exposure to other viral communicable diseases: Secondary | ICD-10-CM | POA: Diagnosis not present

## 2020-01-26 DIAGNOSIS — Z1159 Encounter for screening for other viral diseases: Secondary | ICD-10-CM | POA: Diagnosis not present

## 2020-01-27 ENCOUNTER — Ambulatory Visit: Payer: BC Managed Care – PPO | Admitting: Family Medicine

## 2020-02-02 DIAGNOSIS — Z20828 Contact with and (suspected) exposure to other viral communicable diseases: Secondary | ICD-10-CM | POA: Diagnosis not present

## 2020-02-02 DIAGNOSIS — Z1159 Encounter for screening for other viral diseases: Secondary | ICD-10-CM | POA: Diagnosis not present

## 2020-02-09 DIAGNOSIS — Z20828 Contact with and (suspected) exposure to other viral communicable diseases: Secondary | ICD-10-CM | POA: Diagnosis not present

## 2020-02-09 DIAGNOSIS — Z1159 Encounter for screening for other viral diseases: Secondary | ICD-10-CM | POA: Diagnosis not present

## 2020-02-16 DIAGNOSIS — Z20828 Contact with and (suspected) exposure to other viral communicable diseases: Secondary | ICD-10-CM | POA: Diagnosis not present

## 2020-02-16 DIAGNOSIS — Z1159 Encounter for screening for other viral diseases: Secondary | ICD-10-CM | POA: Diagnosis not present

## 2020-02-23 DIAGNOSIS — Z20828 Contact with and (suspected) exposure to other viral communicable diseases: Secondary | ICD-10-CM | POA: Diagnosis not present

## 2020-02-23 DIAGNOSIS — Z1159 Encounter for screening for other viral diseases: Secondary | ICD-10-CM | POA: Diagnosis not present

## 2020-03-01 DIAGNOSIS — Z20828 Contact with and (suspected) exposure to other viral communicable diseases: Secondary | ICD-10-CM | POA: Diagnosis not present

## 2020-03-01 DIAGNOSIS — Z1159 Encounter for screening for other viral diseases: Secondary | ICD-10-CM | POA: Diagnosis not present

## 2020-03-08 DIAGNOSIS — Z1159 Encounter for screening for other viral diseases: Secondary | ICD-10-CM | POA: Diagnosis not present

## 2020-03-08 DIAGNOSIS — Z20828 Contact with and (suspected) exposure to other viral communicable diseases: Secondary | ICD-10-CM | POA: Diagnosis not present

## 2020-03-15 DIAGNOSIS — Z20828 Contact with and (suspected) exposure to other viral communicable diseases: Secondary | ICD-10-CM | POA: Diagnosis not present

## 2020-03-15 DIAGNOSIS — Z1159 Encounter for screening for other viral diseases: Secondary | ICD-10-CM | POA: Diagnosis not present

## 2020-03-16 ENCOUNTER — Telehealth (INDEPENDENT_AMBULATORY_CARE_PROVIDER_SITE_OTHER): Payer: BC Managed Care – PPO | Admitting: Family Medicine

## 2020-03-16 ENCOUNTER — Encounter: Payer: Self-pay | Admitting: Family Medicine

## 2020-03-16 ENCOUNTER — Other Ambulatory Visit (INDEPENDENT_AMBULATORY_CARE_PROVIDER_SITE_OTHER): Payer: BC Managed Care – PPO

## 2020-03-16 ENCOUNTER — Other Ambulatory Visit: Payer: Self-pay

## 2020-03-16 VITALS — BP 142/96 | HR 68 | Ht 63.0 in

## 2020-03-16 DIAGNOSIS — N912 Amenorrhea, unspecified: Secondary | ICD-10-CM

## 2020-03-16 DIAGNOSIS — K5909 Other constipation: Secondary | ICD-10-CM | POA: Diagnosis not present

## 2020-03-16 DIAGNOSIS — R1084 Generalized abdominal pain: Secondary | ICD-10-CM

## 2020-03-16 LAB — CBC WITH DIFFERENTIAL/PLATELET
Basophils Absolute: 0 10*3/uL (ref 0.0–0.1)
Basophils Relative: 0.3 % (ref 0.0–3.0)
Eosinophils Absolute: 0 10*3/uL (ref 0.0–0.7)
Eosinophils Relative: 0.6 % (ref 0.0–5.0)
HCT: 35.5 % — ABNORMAL LOW (ref 36.0–46.0)
Hemoglobin: 11.6 g/dL — ABNORMAL LOW (ref 12.0–15.0)
Lymphocytes Relative: 36.6 % (ref 12.0–46.0)
Lymphs Abs: 2.3 10*3/uL (ref 0.7–4.0)
MCHC: 32.7 g/dL (ref 30.0–36.0)
MCV: 83.1 fl (ref 78.0–100.0)
Monocytes Absolute: 0.7 10*3/uL (ref 0.1–1.0)
Monocytes Relative: 10.4 % (ref 3.0–12.0)
Neutro Abs: 3.3 10*3/uL (ref 1.4–7.7)
Neutrophils Relative %: 52.1 % (ref 43.0–77.0)
Platelets: 238 10*3/uL (ref 150.0–400.0)
RBC: 4.27 Mil/uL (ref 3.87–5.11)
RDW: 13.6 % (ref 11.5–15.5)
WBC: 6.3 10*3/uL (ref 4.0–10.5)

## 2020-03-16 LAB — COMPREHENSIVE METABOLIC PANEL
ALT: 9 U/L (ref 0–35)
AST: 13 U/L (ref 0–37)
Albumin: 3.8 g/dL (ref 3.5–5.2)
Alkaline Phosphatase: 63 U/L (ref 39–117)
BUN: 6 mg/dL (ref 6–23)
CO2: 24 mEq/L (ref 19–32)
Calcium: 8.8 mg/dL (ref 8.4–10.5)
Chloride: 102 mEq/L (ref 96–112)
Creatinine, Ser: 0.61 mg/dL (ref 0.40–1.20)
GFR: 131.51 mL/min (ref >60.00)
Glucose, Bld: 79 mg/dL (ref 70–99)
Potassium: 3.8 mEq/L (ref 3.5–5.1)
Sodium: 132 mEq/L — ABNORMAL LOW (ref 135–145)
Total Bilirubin: 0.5 mg/dL (ref 0.2–1.2)
Total Protein: 6.3 g/dL (ref 6.0–8.3)

## 2020-03-16 LAB — LIPASE: Lipase: 14 U/L (ref 11.0–59.0)

## 2020-03-16 LAB — HCG, QUANTITATIVE, PREGNANCY: Quantitative HCG: 118359 m[IU]/mL

## 2020-03-16 NOTE — Progress Notes (Signed)
VIRTUAL VISIT Due to national recommendations of social distancing due to COVID 19, a virtual visit is felt to be most appropriate for this patient at this time.   I connected with the patient on 03/16/20 at  9:40 AM EDT by virtual telehealth platform and verified that I am speaking with the correct person using two identifiers.   I discussed the limitations, risks, security and privacy concerns of performing an evaluation and management service by  virtual telehealth platform and the availability of in person appointments. I also discussed with the patient that there may be a patient responsible charge related to this service. The patient expressed understanding and agreed to proceed.  Patient location: Home Provider Location: [facility] Participants: Kerby Nora and [facility]   Chief Complaint  Patient presents with  . Abdominal Pain    x 2 weeks  . Constipation  . Nausea  . Bloated  . Headache    History of Present Illness:  39 year old female patient of Dr. Royden Purl with history of chronic constipation presents with 2 weeks of abdominal pain, constipation, nausea and bloating.   He constipation has been worse  In last 2 weeks. Last BM yesterday but  Small and straining.  Started after she was eating cheese tacos.  Pain  Feels like bloating after eating.. tightness in abdomen. Generalized.  Low back pain.  No dysuria, no urinary frequency.  No sick contacts.   no fever.  She tried fleet enema last night, prune juice ( threw it up).. no relief.  tried miralax.. couldn't get it to go down.  No burping, no heartburn.  She has not been on birth control, sexually active. Last menses April, none in [place].  COVID 19 screen No recent travel or known exposure to COVID19 The patient denies respiratory symptoms of COVID 19 at this time.  The importance of social distancing was discussed today.   Review of Systems  Constitutional: Negative for chills and  fever.  HENT: Negative for congestion and ear pain.   Eyes: Negative for pain and redness.  Respiratory: Negative for cough and shortness of breath.   Cardiovascular: Negative for chest pain, palpitations and leg swelling.  Gastrointestinal: Positive for constipation and nausea. Negative for abdominal pain, blood in stool, diarrhea and vomiting.  Genitourinary: Negative for dysuria.  Musculoskeletal: Negative for falls and myalgias.  Skin: Negative for rash.  Neurological: Negative for dizziness.  Psychiatric/Behavioral: Negative for depression. The patient is not nervous/anxious.       Past Medical History:  Diagnosis Date  . Cyst of knee joint 10/2014   left  . Cyst of left knee joint 10/27/2014  . History of gastric ulcer   . Sinus headache   . 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 She has never used smokeless tobacco. She reports that she does not drink alcohol or use drugs.   Current Outpatient Medications:  .  cyclobenzaprine (FLEXERIL) 10 MG tablet, Take 1 tablet (10 mg total) by mouth 3 (three) times daily as needed for muscle spasms. When not working or driving, Disp: 30 tablet, Rfl: 1   Observations/Objective: Blood pressure (!) 142/96, pulse 68, height 5\' 3"  (1.6 m).  Physical Exam  Physical Exam Constitutional:      General: The patient is not in acute distress. Pulmonary:     Effort: Pulmonary effort is normal. No respiratory distress.  Neurological:     Mental Status: The patient  is alert and oriented to person, place, and time.  Psychiatric:        Mood and Affect: Mood normal.        Behavior: Behavior normal.   Assessment and Plan Generalized abdominal pain Eval with labs.. per pt generalized pain, unable to do exam. No red flags. Likely due to constipation.  Other constipation   Start miralax twice daily for constipation, if not effect after several days, add milk of Magnesia.  Increase water and fiber.  Start  probiotics  ( lactobacilli) like Align.  If not improving as expected follow up in 2 weeks.     I discussed the assessment and treatment plan with the patient. The patient was provided an opportunity to ask questions and all were answered. The patient agreed with the plan and demonstrated an understanding of the instructions.   The patient was advised to call back or seek an in-person evaluation if the symptoms worsen or if the condition fails to improve as anticipated.     Eliezer Lofts, MD

## 2020-03-16 NOTE — Assessment & Plan Note (Signed)
Eval with labs.. per pt generalized pain, unable to do exam. No red flags. Likely due to constipation.

## 2020-03-16 NOTE — Patient Instructions (Signed)
Come in for labs today as soon as able.  Start miralax twice daily for constipation, if not effect after several days, add milk of Magnesia.  Increase water and fiber.  Start probiotics  ( lactobacilli) like Align.  if not improving as expected follow up in 2 weeks.

## 2020-03-16 NOTE — Assessment & Plan Note (Signed)
   Start miralax twice daily for constipation, if not effect after several days, add milk of Magnesia.  Increase water and fiber.  Start probiotics  ( lactobacilli) like Align.  If not improving as expected follow up in 2 weeks.

## 2020-03-16 NOTE — Progress Notes (Signed)
Hannah Walton put on lab scheduled at 11:10 am today.

## 2020-03-24 ENCOUNTER — Other Ambulatory Visit: Payer: Self-pay | Admitting: Family Medicine

## 2020-03-26 ENCOUNTER — Telehealth: Payer: Self-pay | Admitting: Family Medicine

## 2020-03-26 NOTE — Telephone Encounter (Signed)
Patient called today She is requesting a copy of her positive pregnancy results that was done with our office. She is needing to take these to her OBGYN

## 2020-03-26 NOTE — Telephone Encounter (Signed)
Copy of labs printed and placed at the front for pick up. Pt notified

## 2020-04-04 DIAGNOSIS — N911 Secondary amenorrhea: Secondary | ICD-10-CM | POA: Diagnosis not present

## 2020-04-04 DIAGNOSIS — Z3201 Encounter for pregnancy test, result positive: Secondary | ICD-10-CM | POA: Diagnosis not present

## 2020-04-09 DIAGNOSIS — Z3A12 12 weeks gestation of pregnancy: Secondary | ICD-10-CM | POA: Diagnosis not present

## 2020-04-09 DIAGNOSIS — Z3689 Encounter for other specified antenatal screening: Secondary | ICD-10-CM | POA: Diagnosis not present

## 2020-04-09 DIAGNOSIS — O26891 Other specified pregnancy related conditions, first trimester: Secondary | ICD-10-CM | POA: Diagnosis not present

## 2020-04-09 DIAGNOSIS — O09521 Supervision of elderly multigravida, first trimester: Secondary | ICD-10-CM | POA: Diagnosis not present

## 2020-04-09 DIAGNOSIS — Z363 Encounter for antenatal screening for malformations: Secondary | ICD-10-CM | POA: Diagnosis not present

## 2020-04-09 DIAGNOSIS — Z113 Encounter for screening for infections with a predominantly sexual mode of transmission: Secondary | ICD-10-CM | POA: Diagnosis not present

## 2020-04-09 DIAGNOSIS — O09511 Supervision of elderly primigravida, first trimester: Secondary | ICD-10-CM | POA: Diagnosis not present

## 2020-04-12 LAB — OB RESULTS CONSOLE ABO/RH: RH Type: POSITIVE

## 2020-04-12 LAB — OB RESULTS CONSOLE RPR: RPR: NONREACTIVE

## 2020-04-12 LAB — OB RESULTS CONSOLE RUBELLA ANTIBODY, IGM: Rubella: IMMUNE

## 2020-04-12 LAB — OB RESULTS CONSOLE GC/CHLAMYDIA
Chlamydia: NEGATIVE
Gonorrhea: NEGATIVE

## 2020-04-12 LAB — OB RESULTS CONSOLE HEPATITIS B SURFACE ANTIGEN: Hepatitis B Surface Ag: NEGATIVE

## 2020-04-12 LAB — OB RESULTS CONSOLE HIV ANTIBODY (ROUTINE TESTING): HIV: NONREACTIVE

## 2020-04-19 DIAGNOSIS — Z20828 Contact with and (suspected) exposure to other viral communicable diseases: Secondary | ICD-10-CM | POA: Diagnosis not present

## 2020-04-19 DIAGNOSIS — Z1159 Encounter for screening for other viral diseases: Secondary | ICD-10-CM | POA: Diagnosis not present

## 2020-04-26 DIAGNOSIS — Z20828 Contact with and (suspected) exposure to other viral communicable diseases: Secondary | ICD-10-CM | POA: Diagnosis not present

## 2020-04-26 DIAGNOSIS — Z1159 Encounter for screening for other viral diseases: Secondary | ICD-10-CM | POA: Diagnosis not present

## 2020-05-03 DIAGNOSIS — Z20828 Contact with and (suspected) exposure to other viral communicable diseases: Secondary | ICD-10-CM | POA: Diagnosis not present

## 2020-05-03 DIAGNOSIS — Z1159 Encounter for screening for other viral diseases: Secondary | ICD-10-CM | POA: Diagnosis not present

## 2020-05-10 DIAGNOSIS — Z20828 Contact with and (suspected) exposure to other viral communicable diseases: Secondary | ICD-10-CM | POA: Diagnosis not present

## 2020-05-10 DIAGNOSIS — Z1159 Encounter for screening for other viral diseases: Secondary | ICD-10-CM | POA: Diagnosis not present

## 2020-05-17 DIAGNOSIS — Z1159 Encounter for screening for other viral diseases: Secondary | ICD-10-CM | POA: Diagnosis not present

## 2020-05-17 DIAGNOSIS — Z20828 Contact with and (suspected) exposure to other viral communicable diseases: Secondary | ICD-10-CM | POA: Diagnosis not present

## 2020-05-19 DIAGNOSIS — Z20822 Contact with and (suspected) exposure to covid-19: Secondary | ICD-10-CM | POA: Diagnosis not present

## 2020-05-19 DIAGNOSIS — Z3A18 18 weeks gestation of pregnancy: Secondary | ICD-10-CM | POA: Diagnosis not present

## 2020-05-21 DIAGNOSIS — Z1159 Encounter for screening for other viral diseases: Secondary | ICD-10-CM | POA: Diagnosis not present

## 2020-05-21 DIAGNOSIS — Z20828 Contact with and (suspected) exposure to other viral communicable diseases: Secondary | ICD-10-CM | POA: Diagnosis not present

## 2020-05-22 ENCOUNTER — Telehealth: Payer: Self-pay | Admitting: *Deleted

## 2020-05-22 NOTE — Telephone Encounter (Signed)
Aware, thanks!

## 2020-05-22 NOTE — Telephone Encounter (Signed)
Patient called stating that her husband tested positive for covid Friday. Patient stated that she started with a cough Sunday. Patient stated that she did a rapid test Saturday and it was negative. Patient stated that she did a send off test Saturday and it was negative. Patient stated that she did another send off test yesterday but she has not gotten those results back yet. Patient stated that she has had a headache, chills and cough. Patient denies any SOB or difficulty breathing. Patient stated that she is 5 months pregnant. Patient stated that she called her OB?GYN yesterday and she advised them of her exposure and symptoms. Patient stated that they were more concerned about changing an appointment that she has coming up. Patient stated that she is at work and her employer knows about her symptoms and exposure. After speaking with Dr. Milinda Antis patient was advised that she should not be at work until her results are back. Patient was advised that she needs to contact her OB/GYN and advise them that Dr. Milinda Antis requested that she call them for their guidance since she is 5 months pregnant. Patient was given ER/UC precautions and she verbalized understanding. Patient stated that she will call back and let Dr. Milinda Antis know the results of her last covid test results.Patient was advised again if her symptoms get worse she needs a face to face visit for evaluation.

## 2020-05-23 NOTE — Telephone Encounter (Signed)
Pt called back to give covid test results She stated neg test from 8/2 She had this done at her work

## 2020-05-23 NOTE — Telephone Encounter (Signed)
That is very reassuring!  Make sure she lets her obgyn know as well.   I suspect her job will let her work when she feels well enough to go back

## 2020-05-23 NOTE — Telephone Encounter (Signed)
Pt notified of Dr. Royden Purl comments and verbalized understanding. Pt did update ob/gyn also

## 2020-05-24 DIAGNOSIS — U071 COVID-19: Secondary | ICD-10-CM | POA: Diagnosis not present

## 2020-05-28 NOTE — Telephone Encounter (Signed)
Patient left a voicemail stating that she needs to give an update on her covid test.

## 2020-05-28 NOTE — Telephone Encounter (Signed)
LVM.  Pt had rapid covid test on 8/5 at [facility] that was positive.

## 2020-05-29 NOTE — Telephone Encounter (Signed)
LVM

## 2020-05-30 ENCOUNTER — Emergency Department
Admission: EM | Admit: 2020-05-30 | Discharge: 2020-05-30 | Disposition: A | Discharge status: Home / Self Care | Payer: BC Managed Care – PPO | Attending: Emergency Medicine | Admitting: Emergency Medicine

## 2020-05-30 ENCOUNTER — Other Ambulatory Visit: Payer: Self-pay

## 2020-05-30 ENCOUNTER — Encounter: Payer: Self-pay | Admitting: Emergency Medicine

## 2020-05-30 DIAGNOSIS — R05 Cough: Secondary | ICD-10-CM | POA: Diagnosis not present

## 2020-05-30 DIAGNOSIS — U071 COVID-19: Secondary | ICD-10-CM | POA: Insufficient documentation

## 2020-05-30 DIAGNOSIS — O98512 Other viral diseases complicating pregnancy, second trimester: Secondary | ICD-10-CM | POA: Diagnosis not present

## 2020-05-30 MED ORDER — ALBUTEROL SULFATE HFA 108 (90 BASE) MCG/ACT IN AERS: 2.0000 | INHALATION_SPRAY | Freq: Four times a day (QID) | RESPIRATORY_TRACT | 0 refills | Status: DC | PRN

## 2020-05-30 NOTE — Telephone Encounter (Signed)
Contacted pt who reports her back has been hurting due to the cough. Pt denies any other symptoms. Overall she reports doing well but is also having constipation. She is staying well hydrated but has not had a BM for last 5 days. She has also stayed in contact with her OB-GYN and they are aware. She also wanted Dr. Milinda Antis to know and wanted to know if there was anything she could do for her back. Advised a msg would be sent to her and this office would f/u. Pt verbalized understanding.

## 2020-05-30 NOTE — ED Notes (Signed)
See triage note   States she is COVID positive    conts to have body aches,fever and sore throat  States she is 5 months preg  And has only been taking tylenol   Low grade fever on arrival

## 2020-05-30 NOTE — Telephone Encounter (Signed)
Yes- I would appreciate that if she is open to it.  Tylenol for back pain and stay in close contact.  Virtual visit if needed  Thanks for trying to reach her

## 2020-05-30 NOTE — ED Triage Notes (Signed)
Tested positive for covid test last Friday.  Is 5 months pregnant. Here for body aches, cough, sore throat, headache.  Aching throughout whole back.  Lost taste and smell.  Has been taking tylenol but not helping feel better.  No actual chest pain, only lung pain with cough.

## 2020-05-30 NOTE — Discharge Instructions (Addendum)
Continue to take your OTC/home meds as directed. Follow-up with your provider for continued symptoms.

## 2020-05-30 NOTE — ED Notes (Signed)
Pt unable to sign E-signature due to signature pad malfunction. Pt verbalized understanding of d/c instructions and had no additional questions or concerns for this RN or provider. Pt left with d/c instructions and gathered all personal belongings from room and removed them prior to ED departure.   

## 2020-05-30 NOTE — Telephone Encounter (Signed)
Advised pt of PCP msg. Pt will think about the antibody infusion and contact the office if she decides she wants to do that. Advised she is only eligible to have it within 10 days of symptom onset. Advised pt could also have VV. Advised of tylenol. Advised if anything else is needed to contact office. Pt verbalized understanding.

## 2020-05-31 ENCOUNTER — Encounter: Payer: Self-pay | Admitting: Emergency Medicine

## 2020-05-31 ENCOUNTER — Emergency Department: Payer: BC Managed Care – PPO

## 2020-05-31 ENCOUNTER — Emergency Department
Admission: EM | Admit: 2020-05-31 | Discharge: 2020-05-31 | Disposition: A | Discharge status: Home / Self Care | Payer: BC Managed Care – PPO | Attending: Student in an Organized Health Care Education/Training Program | Admitting: Student in an Organized Health Care Education/Training Program

## 2020-05-31 DIAGNOSIS — U071 COVID-19: Secondary | ICD-10-CM | POA: Insufficient documentation

## 2020-05-31 DIAGNOSIS — Z3A20 20 weeks gestation of pregnancy: Secondary | ICD-10-CM | POA: Diagnosis not present

## 2020-05-31 DIAGNOSIS — O98512 Other viral diseases complicating pregnancy, second trimester: Secondary | ICD-10-CM | POA: Insufficient documentation

## 2020-05-31 DIAGNOSIS — O26892 Other specified pregnancy related conditions, second trimester: Secondary | ICD-10-CM | POA: Diagnosis not present

## 2020-05-31 DIAGNOSIS — R0602 Shortness of breath: Secondary | ICD-10-CM | POA: Diagnosis not present

## 2020-05-31 MED ORDER — GUAIFENESIN 100 MG/5ML PO SOLN
10.0000 mL | Freq: Once | ORAL | Status: AC
  Administered 2020-05-31: 200 mg via ORAL
  Filled 2020-05-31: qty 10

## 2020-05-31 MED ORDER — AZITHROMYCIN 250 MG PO TABS: ORAL_TABLET | ORAL | 0 refills | Status: DC

## 2020-05-31 NOTE — ED Triage Notes (Addendum)
Pt here yesterday and tested +COVID (Friday at urgent care); rx inhaler but frequent nonprod cough persists; st unable to sleep due to cough & congestion

## 2020-05-31 NOTE — ED Provider Notes (Signed)
[facility] ([facility]) Emergency Department Provider Note  ____________________________________________  Time seen: Approximately 7:48 AM  I have reviewed the triage vital signs and the nursing notes.   HISTORY  Chief Complaint Cough    HPI Hannah Walton is a 39 y.o. female that presents to the emergency department for evaluation of nasal congestion, cough for 1 week.  Patient is 5 months pregnant.  Patient has a little bit of shortness of breath and a little bit of back pain, only with coughing.  She was evaluated in our emergency room last night and given a prescription for an albuterol inhaler but states that the inhaler made her feel funny.  She was unable to sleep last night due to her cough so she returned this morning.  She is unsure which other medication she is able to take while being pregnant.  She is actively following with OB.  No abdominal pain or vaginal bleeding. She tested positive last Friday at urgent care.  No fever, chest pain, vomiting, abdominal pain.   Past Medical History:  Diagnosis Date  . Cyst of knee joint 10/2014   left  . Cyst of left knee joint 10/27/2014  . History of gastric ulcer   . Sinus headache   . Synovitis of knee 10/2014   left    Patient Active Problem List   Diagnosis Date Noted  . Generalized abdominal pain 03/16/2020  . Elevated BP without diagnosis of hypertension 05/12/2019  . Headache 05/12/2019  . Paresthesia of left arm 05/11/2017  . Amenorrhea 11/26/2015  . Encounter for routine gynecological examination 05/16/2015  . Routine general medical examination at a health care facility 05/08/2015  . Other constipation 03/07/2014    Past Surgical History:  Procedure Laterality Date  . KNEE ARTHROSCOPY Left 10/27/2014   Procedure: LEFT KNEE SCOPE SYNOVECTOMY;  Surgeon: Eulas Post, MD;  Location: [facility];  Service: Orthopedics;  Laterality: Left;  Marland Kitchen MASS EXCISION Left 10/27/2014   Procedure:  LEFT PCL CYST EXCISION ;  Surgeon: Eulas Post, MD;  Location:  SUR[facility]ERY [facility];  Service: Orthopedics;  Laterality: Left;  . UPPER GI ENDOSCOPY      Prior to Admission medications   Medication Sig Start Date End Date Taking? Authorizing Provider  albuterol (VENTOLIN HFA) 108 (90 Base) MCG/ACT inhaler Inhale 2 puffs into the lungs every 6 (six) hours as needed. 05/30/20   Menshew, Charlesetta Ivory, PA-C  azithromycin (ZITHROMAX Z-PAK) 250 MG tablet Take 2 tablets (500 mg) on  Day 1,  followed by 1 tablet (250 mg) once daily on Days 2 through 5. 05/31/20   Enid Derry, PA-C    Allergies Yasmin [drospirenone-ethinyl estradiol], Asa [aspirin], and Ibuprofen  Family History  Problem Relation Age of Onset  . Arthritis Mother   . Diabetes Maternal Grandmother   . Arthritis Paternal Grandmother   . Stroke Father   . Stroke Paternal Uncle   . Diabetes Paternal Uncle     Social History Social History   Tobacco Use  . Smoking status: Never Smoker  . Smokeless tobacco: Never Used  Vaping Use  . Vaping Use: Never used  Substance Use Topics  . Alcohol use: No    Alcohol/week: 0.0 standard drinks  . Drug use: No     Review of Systems  Constitutional: No fever/chills Eyes: No visual changes. No discharge. ENT: Positive for congestion and rhinorrhea. Cardiovascular: No chest pain. Respiratory: Positive for cough. Minimal SOB. Gastrointestinal: No abdominal pain.  No nausea, no vomiting.  No diarrhea.  No constipation. Musculoskeletal: Negative for musculoskeletal pain. Skin: Negative for rash, abrasions, lacerations, ecchymosis. Neurological: Negative for headaches.   ____________________________________________   PHYSICAL EXAM:  VITAL SIGNS: ED Triage Vitals  Enc Vitals Group     BP 05/31/20 0624 118/66     Pulse Rate 05/31/20 0728 90     Resp 05/31/20 0624 18     Temp 05/31/20 0624 98.9 F (37.2 C)     Temp Source 05/31/20 0624 Oral     SpO2 05/31/20 0624  98 %     Weight 05/31/20 0625 214 lb (97.1 kg)     Height 05/31/20 0625 5\' 2"  (1.575 m)     Head Circumference --      Peak Flow --      Pain Score 05/31/20 0625 0     Pain Loc --      Pain Edu? --      Excl. in GC? --      Constitutional: Alert and oriented. Well appearing and in no acute distress. Eyes: Conjunctivae are normal. PERRL. EOMI. No discharge. Head: Atraumatic. ENT: No frontal and maxillary sinus tenderness.      Ears: Tympanic membranes pearly gray with good landmarks. No discharge.      Nose: Mild congestion/rhinnorhea.      Mouth/Throat: Mucous membranes are moist. Oropharynx non-erythematous. Tonsils not enlarged. No exudates. Uvula midline. Neck: No stridor.   Hematological/Lymphatic/Immunilogical: No cervical lymphadenopathy. Cardiovascular: Normal rate, regular rhythm.  Good peripheral circulation. Respiratory: Normal respiratory effort without tachypnea or retractions. Lungs CTAB. Good air entry to the bases with no decreased or absent breath sounds. Gastrointestinal: Bowel sounds 4 quadrants. Soft and nontender to palpation. No guarding or rigidity. No palpable masses. No distention. Musculoskeletal: Full range of motion to all extremities. No gross deformities appreciated. Neurologic:  Normal speech and language. No gross focal neurologic deficits are appreciated.  Skin:  Skin is warm, dry and intact. No rash noted. Psychiatric: Mood and affect are normal. Speech and behavior are normal. Patient exhibits appropriate insight and judgement.   ____________________________________________   LABS (all labs ordered are listed, but only abnormal results are displayed)  Labs Reviewed - No data to display ____________________________________________  EKG  SR ____________________________________________  RADIOLOGY   No results found.  ____________________________________________    PROCEDURES  Procedure(s) performed:     Procedures    Medications  guaiFENesin (ROBITUSSIN) 100 MG/5ML solution 200 mg (200 mg Oral Given 05/31/20 0807)     ____________________________________________   INITIAL IMPRESSION / ASSESSMENT AND PLAN / ED COURSE  Pertinent labs & imaging results that were available during my care of the patient were reviewed by me and considered in my medical decision making (see chart for details).  Review of the [place] CSRS was performed in accordance of the [facility] prior to dispensing any controlled drugs.   Patient's diagnosis is consistent with Covid 19. Vital signs and exam are reassuring.  Patient's cough significantly improved with Robitussin.  Patient had some mild shortness of breath and back pain only with coughing.  She denies any shortness of breath or back pain after medication and reduction in coughing. PERC and Wells scores are zero. Oxygen saturations remained above 96% with ambulation.   Patient feels comfortable going home. Patient will be discharged home with prescriptions for azithromycin. Patient is to follow up with primary care as needed or otherwise directed. Patient is given ED precautions to return to the ED for any worsening  or new symptoms.   Adreanne L Walton was evaluated in Emergency Department on 05/31/2020 for the symptoms described in the history of present illness. She was evaluated in the context of the global COVID-19 pandemic, which necessitated consideration that the patient might be at risk for infection with the SARS-CoV-2 virus that causes COVID-19. Institutional protocols and algorithms that pertain to the evaluation of patients at risk for COVID-19 are in a state of rapid change based on information released by regulatory bodies including the CDC and federal and state organizations. These policies and algorithms were followed during the patient's care in the ED. ____________________________________________  FINAL CLINICAL IMPRESSION(S) / ED  DIAGNOSES  Final diagnoses:  COVID-19      NEW MEDICATIONS STARTED DURING THIS VISIT:  ED Discharge Orders         Ordered    azithromycin (ZITHROMAX Z-PAK) 250 MG tablet     Discontinue  Reprint     05/31/20 0901              This chart was dictated using voice recognition software/Dragon. Despite best efforts to proofread, errors can occur which can change the meaning. Any change was purely unintentional.    Enid Derry, PA-C 05/31/20 1547    Willy Eddy, MD 05/31/20 [phone]

## 2020-05-31 NOTE — ED Notes (Signed)
See triage note  Presents with cough and some SOB  States she tested positive for COVID last week  Presents with cough and SOB  No fever  States she is having some discomfort in mid back with breathing and cough  Has tried OTC meds w/o relief

## 2020-05-31 NOTE — ED Notes (Signed)
No protocols at this time per Dr Manson Passey

## 2020-05-31 NOTE — ED Provider Notes (Signed)
[facility] Emergency Department Provider Note ____________________________________________  Time seen: 1901  I have reviewed the triage vital signs and the nursing notes.  HISTORY  Chief Complaint  Generalized Body Aches  HPI Hannah Walton is a 39 y.o. female presents her self to the ED for evaluation of symptoms related to recent Covid diagnosis.  Patient who is 5 months pregnant, was diagnosed with Covid last week.  Her husband was her source patient.  Patient reports cough , generalized body aches, sore throat, and headache.  Patient describes loss of her taste and smell sensation at this time.  She has been taking Tylenol at home but denies any significant benefit.  She denies any frank fevers, chest pain, hemoptysis, or syncope.  Past Medical History:  Diagnosis Date  . Cyst of knee joint 10/2014   left  . Cyst of left knee joint 10/27/2014  . History of gastric ulcer   . Sinus headache   . Synovitis of knee 10/2014   left    Patient Active Problem List   Diagnosis Date Noted  . Generalized abdominal pain 03/16/2020  . Elevated BP without diagnosis of hypertension 05/12/2019  . Headache 05/12/2019  . Paresthesia of left arm 05/11/2017  . Amenorrhea 11/26/2015  . Encounter for routine gynecological examination 05/16/2015  . Routine general medical examination at a health care facility 05/08/2015  . Other constipation 03/07/2014    Past Surgical History:  Procedure Laterality Date  . KNEE ARTHROSCOPY Left 10/27/2014   Procedure: LEFT KNEE SCOPE SYNOVECTOMY;  Surgeon: Eulas Post, MD;  Location: [facility];  Service: Orthopedics;  Laterality: Left;  Marland Kitchen MASS EXCISION Left 10/27/2014   Procedure: LEFT PCL CYST EXCISION ;  Surgeon: Eulas Post, MD;  Location: [facility];  Service: Orthopedics;  Laterality: Left;  . UPPER GI ENDOSCOPY      Prior to Admission medications   Medication Sig Start Date End Date  Taking? Authorizing Provider  albuterol (VENTOLIN HFA) 108 (90 Base) MCG/ACT inhaler Inhale 2 puffs into the lungs every 6 (six) hours as needed. 05/30/20   Husna Krone, Charlesetta Ivory, PA-C  azithromycin (ZITHROMAX Z-PAK) 250 MG tablet Take 2 tablets (500 mg) on  Day 1,  followed by 1 tablet (250 mg) once daily on Days 2 through 5. 05/31/20   Enid Derry, PA-C    Allergies Yasmin [drospirenone-ethinyl estradiol], Asa [aspirin], and Ibuprofen  Family History  Problem Relation Age of Onset  . Arthritis Mother   . Diabetes Maternal Grandmother   . Arthritis Paternal Grandmother   . Stroke Father   . Stroke Paternal Uncle   . Diabetes Paternal Uncle     Social History Social History   Tobacco Use  . Smoking status: Never Smoker  . Smokeless tobacco: Never Used  Vaping Use  . Vaping Use: Never used  Substance Use Topics  . Alcohol use: No    Alcohol/week: 0.0 standard drinks  . Drug use: No    Review of Systems  Constitutional: Negative for fever. Eyes: Negative for visual changes. ENT: positive for sore throat. Cardiovascular: Negative for chest pain. Respiratory: Negative for shortness of breath.  Reports nonproductive cough Gastrointestinal: Negative for abdominal pain, vomiting and diarrhea. Genitourinary: Negative for dysuria. Musculoskeletal: Negative for back pain.  Reports generalized body aches Skin: Negative for rash. Neurological: Negative for headaches, focal weakness or numbness. ____________________________________________  PHYSICAL EXAM:  VITAL SIGNS: ED Triage Vitals [05/30/20 1702]  Enc Vitals Group  BP 125/76     Pulse Rate (!) 105     Resp 20     Temp 99.8 F (37.7 C)     Temp Source Oral     SpO2 100 %     Weight 214 lb (97.1 kg)     Height 5\' 2"  (1.575 m)     Head Circumference      Peak Flow      Pain Score 10     Pain Loc      Pain Edu?      Excl. in GC?     Constitutional: Alert and oriented. Well appearing and in no  distress. Head: Normocephalic and atraumatic. Eyes: Conjunctivae are normal. Normal extraocular movements Ears: Canals clear. TMs intact bilaterally. Nose: No congestion/rhinorrhea/epistaxis. Neck: Supple. No thyromegaly. Hematological/Lymphatic/Immunological: No cervical lymphadenopathy. Cardiovascular: Normal rate, regular rhythm. Normal distal pulses. Respiratory: Normal respiratory effort. No wheezes/rales/rhonchi. Gastrointestinal: Gravid, soft and nontender. No distention. Musculoskeletal: Nontender with normal range of motion in all extremities.  Neurologic:  Normal gait without ataxia. Normal speech and language. No gross focal neurologic deficits are appreciated. Skin:  Skin is warm, dry and intact. No rash noted. ____________________________________________  PROCEDURES  Procedures ____________________________________________  INITIAL IMPRESSION / ASSESSMENT AND PLAN / ED COURSE  Gravid patient with ED evaluation of symptoms related to her recent Covid diagnosis.  Patient is clinically stable at this time.  Vital signs also stable with a reassuring O2 sat of 100%.  Patient without signs of dehydration, respiratory distress, or toxic appearance.  Her primary complaint is persistent cough.  We discussed treatment options including albuterol inhaler and possible oral steroids.  I discussed with the patient the risk-benefit associated with a steroid taper at this time.  We agreed that albuterol inhaler in addition to over-the-counter cough medicine and additional meds would be appropriate.  Patient will follow up with her primary provider, OB provider, or return to the ED as needed.  Hannah Walton was evaluated in Emergency Department on 05/31/2020 for the symptoms described in the history of present illness. She was evaluated in the context of the global COVID-19 pandemic, which necessitated consideration that the patient might be at risk for infection with the SARS-CoV-2  virus that causes COVID-19. Institutional protocols and algorithms that pertain to the evaluation of patients at risk for COVID-19 are in a state of rapid change based on information released by regulatory bodies including the CDC and federal and state organizations. These policies and algorithms were followed during the patient's care in the ED. ____________________________________________  FINAL CLINICAL IMPRESSION(S) / ED DIAGNOSES  Final diagnoses:  COVID-19      07/31/2020, PA-C 05/31/20 1525    07/31/20, MD 05/31/20 1725

## 2020-06-06 ENCOUNTER — Telehealth: Payer: Self-pay | Admitting: Family Medicine

## 2020-06-06 NOTE — Telephone Encounter (Signed)
Pt notified and will have family pick up letter

## 2020-06-06 NOTE — Telephone Encounter (Signed)
Pt said she needs a work note. She said she is not over Covid. She is still having some shortness of breath at times and her job is wanting to know when she will be back. She would like a call back from Dr. Royden Purl nurse about the work note.

## 2020-06-06 NOTE — Telephone Encounter (Signed)
Called pt and she needs a work note she is still not feeling 100% and due to her being pregnant she doesn't want to rush back to work since she is still having covid sxs., cough, congestion, SOB with moving to much. Pt said her sxs have improved but she would like a work not just saying that she can return to work on the 06/12/20 if she's feeing better

## 2020-06-06 NOTE — Telephone Encounter (Signed)
Done and in IN box 

## 2020-06-13 DIAGNOSIS — Z3A22 22 weeks gestation of pregnancy: Secondary | ICD-10-CM | POA: Diagnosis not present

## 2020-06-13 DIAGNOSIS — O09522 Supervision of elderly multigravida, second trimester: Secondary | ICD-10-CM | POA: Diagnosis not present

## 2020-06-13 DIAGNOSIS — O99212 Obesity complicating pregnancy, second trimester: Secondary | ICD-10-CM | POA: Diagnosis not present

## 2020-07-06 ENCOUNTER — Telehealth: Payer: Self-pay

## 2020-07-06 NOTE — Telephone Encounter (Signed)
[facility] Day - Client TELEPHONE ADVICE RECORD AccessNurse Patient Name: Hannah Walton [facility] Gender: Female DOB: Oct 05, 1981 Age: 39 Y 2 M 14 D Return Phone Number: [phone] (Primary), [phone] (Secondary) Address: [place] [address] Client [facility] Day - Client Client Site [facility] - Day Physician Milinda Antis, Idamae Schuller - MD Contact Type Call Who Is Calling Patient / Member / Family / Caregiver Call Type Triage / Clinical Relationship To Patient Self Return Phone Number [phone] (Primary) Chief Complaint CHEST PAIN (>=21 years) - pain, pressure, heaviness or tightness Reason for Call Symptomatic / Request for Health Information Initial Comment Caller stated she is having right side pain from shoulder to leg for two weeks, now also having chest pain. GOTO Facility Not Listed [place] Translation No Nurse Assessment Nurse: Stefano Gaul, RN, Vera Date/Time (Eastern Time): 07/06/2020 1:30:57 PM Confirm and document reason for call. If symptomatic, describe symptoms. ---Caller states she has been having right sided pain from her shoulder to her leg for 2 weeks. She is [redacted] weeks pregnant. has chest pain in the center of her chest. has had chest pain since she woke up. Has the patient had close contact with a person known or suspected to have the novel coronavirus illness OR traveled / lives in area with major community spread (including international travel) in the last 14 days from the onset of symptoms? * If Asymptomatic, screen for exposure and travel within the last 14 days. ---No Does the patient have any new or worsening symptoms? ---Yes Will a triage be completed? ---Yes Related visit to physician within the last 2 weeks? ---No Does the PT have any chronic conditions? (i.e. diabetes, asthma, this includes High risk factors for pregnancy, etc.) ---No Is the patient pregnant or  possibly pregnant? (Ask all females between the ages of 68-55) ---Yes How many weeks gestation? ---21 weeks What is the estimated delivery date? ---2020-10-16 Total number of pregnancies including current? ---1 Number of live births? ---0 Have you felt decreased fetal movement? ---No PLEASE NOTE: All timestamps contained within this report are represented as [place] Standard Time. CONFIDENTIALTY NOTICE: This fax transmission is intended only for the addressee. It contains information that is legally privileged, confidential or otherwise protected from use or disclosure. If you are not the intended recipient, you are strictly prohibited from reviewing, disclosing, copying using or disseminating any of this information or taking any action in reliance on or regarding this information. If you have received this fax in error, please notify us immediately by telephone so that we can arrange for its return to [place]. Phone: [phone], Toll-Free: [phone], Fax: [phone] Page: 2 of 2 Call Id: [id number] Nurse Assessment Is this a behavioral health or substance abuse call? ---No Guidelines Guideline Title Affirmed Question Affirmed Notes Nurse Date/Time Lamount Cohen Time) Chest Pain Difficulty breathing Stefano Gaul, RN, Dwana Curd 07/06/2020 1:34:27 PM Disp. Time Lamount Cohen Time) Disposition Final User 07/06/2020 1:30:03 PM Send to Urgent Queue Izola Price 07/06/2020 1:38:18 PM Go to ED Now Yes Stefano Gaul, RN, Clerance Lav Disagree/Comply Comply Caller Understands Yes PreDisposition Did not know what to do Care Advice Given Per Guideline GO TO ED NOW: * You need to be seen in the Emergency Department. NOTE TO TRIAGER - DRIVING: * Another adult should drive. Referrals GO TO FACILITY OTHER - SPECIFY

## 2020-07-06 NOTE — Telephone Encounter (Signed)
I spoke with pt; pt has not gone to ED yet but pt said she was going to go to [facility] ED today.

## 2020-07-23 DIAGNOSIS — Z3689 Encounter for other specified antenatal screening: Secondary | ICD-10-CM | POA: Diagnosis not present

## 2020-07-23 DIAGNOSIS — Z7189 Other specified counseling: Secondary | ICD-10-CM | POA: Diagnosis not present

## 2020-07-23 LAB — OB RESULTS CONSOLE HIV ANTIBODY (ROUTINE TESTING): HIV: NONREACTIVE

## 2020-07-23 LAB — OB RESULTS CONSOLE RPR: RPR: NONREACTIVE

## 2020-08-02 DIAGNOSIS — Z1159 Encounter for screening for other viral diseases: Secondary | ICD-10-CM | POA: Diagnosis not present

## 2020-08-02 DIAGNOSIS — Z20828 Contact with and (suspected) exposure to other viral communicable diseases: Secondary | ICD-10-CM | POA: Diagnosis not present

## 2020-08-09 DIAGNOSIS — Z20828 Contact with and (suspected) exposure to other viral communicable diseases: Secondary | ICD-10-CM | POA: Diagnosis not present

## 2020-08-09 DIAGNOSIS — Z1159 Encounter for screening for other viral diseases: Secondary | ICD-10-CM | POA: Diagnosis not present

## 2020-08-23 DIAGNOSIS — Z1159 Encounter for screening for other viral diseases: Secondary | ICD-10-CM | POA: Diagnosis not present

## 2020-08-23 DIAGNOSIS — Z20828 Contact with and (suspected) exposure to other viral communicable diseases: Secondary | ICD-10-CM | POA: Diagnosis not present

## 2020-08-30 DIAGNOSIS — Z20828 Contact with and (suspected) exposure to other viral communicable diseases: Secondary | ICD-10-CM | POA: Diagnosis not present

## 2020-08-30 DIAGNOSIS — Z1159 Encounter for screening for other viral diseases: Secondary | ICD-10-CM | POA: Diagnosis not present

## 2020-09-06 DIAGNOSIS — Z1159 Encounter for screening for other viral diseases: Secondary | ICD-10-CM | POA: Diagnosis not present

## 2020-09-06 DIAGNOSIS — Z20828 Contact with and (suspected) exposure to other viral communicable diseases: Secondary | ICD-10-CM | POA: Diagnosis not present

## 2020-09-18 DIAGNOSIS — Z3685 Encounter for antenatal screening for Streptococcus B: Secondary | ICD-10-CM | POA: Diagnosis not present

## 2020-09-20 DIAGNOSIS — Z20828 Contact with and (suspected) exposure to other viral communicable diseases: Secondary | ICD-10-CM | POA: Diagnosis not present

## 2020-09-20 DIAGNOSIS — Z1159 Encounter for screening for other viral diseases: Secondary | ICD-10-CM | POA: Diagnosis not present

## 2020-09-20 LAB — OB RESULTS CONSOLE GBS: GBS: NEGATIVE

## 2020-09-25 DIAGNOSIS — O09523 Supervision of elderly multigravida, third trimester: Secondary | ICD-10-CM | POA: Diagnosis not present

## 2020-09-25 DIAGNOSIS — Z3A37 37 weeks gestation of pregnancy: Secondary | ICD-10-CM | POA: Diagnosis not present

## 2020-09-25 DIAGNOSIS — O99213 Obesity complicating pregnancy, third trimester: Secondary | ICD-10-CM | POA: Diagnosis not present

## 2020-09-27 DIAGNOSIS — Z1159 Encounter for screening for other viral diseases: Secondary | ICD-10-CM | POA: Diagnosis not present

## 2020-09-27 DIAGNOSIS — Z20828 Contact with and (suspected) exposure to other viral communicable diseases: Secondary | ICD-10-CM | POA: Diagnosis not present

## 2020-10-04 DIAGNOSIS — Z20828 Contact with and (suspected) exposure to other viral communicable diseases: Secondary | ICD-10-CM | POA: Diagnosis not present

## 2020-10-04 DIAGNOSIS — Z1159 Encounter for screening for other viral diseases: Secondary | ICD-10-CM | POA: Diagnosis not present

## 2020-10-05 ENCOUNTER — Telehealth (HOSPITAL_COMMUNITY): Payer: Self-pay | Admitting: *Deleted

## 2020-10-05 ENCOUNTER — Encounter (HOSPITAL_COMMUNITY): Payer: Self-pay | Admitting: *Deleted

## 2020-10-05 NOTE — Telephone Encounter (Signed)
Preadmission screen  

## 2020-10-08 ENCOUNTER — Telehealth (HOSPITAL_COMMUNITY): Payer: Self-pay | Admitting: *Deleted

## 2020-10-08 NOTE — Telephone Encounter (Signed)
Preadmission screen  

## 2020-10-09 ENCOUNTER — Telehealth (HOSPITAL_COMMUNITY): Payer: Self-pay | Admitting: *Deleted

## 2020-10-09 NOTE — Telephone Encounter (Signed)
Preadmission screen  

## 2020-10-10 ENCOUNTER — Encounter (HOSPITAL_COMMUNITY): Payer: Self-pay | Admitting: *Deleted

## 2020-10-10 ENCOUNTER — Telehealth (HOSPITAL_COMMUNITY): Payer: Self-pay | Admitting: *Deleted

## 2020-10-10 NOTE — Telephone Encounter (Signed)
Preadmission screen  

## 2020-10-15 ENCOUNTER — Other Ambulatory Visit: Payer: Self-pay | Admitting: Obstetrics and Gynecology

## 2020-10-15 ENCOUNTER — Other Ambulatory Visit (HOSPITAL_COMMUNITY)
Admission: RE | Admit: 2020-10-15 | Discharge: 2020-10-15 | Disposition: A | Discharge status: Home / Self Care | Payer: BC Managed Care – PPO | Source: Ambulatory Visit | Attending: Obstetrics and Gynecology | Admitting: Obstetrics and Gynecology

## 2020-10-15 DIAGNOSIS — Z01812 Encounter for preprocedural laboratory examination: Secondary | ICD-10-CM | POA: Insufficient documentation

## 2020-10-15 DIAGNOSIS — O99214 Obesity complicating childbirth: Secondary | ICD-10-CM | POA: Diagnosis not present

## 2020-10-15 DIAGNOSIS — Z3A40 40 weeks gestation of pregnancy: Secondary | ICD-10-CM | POA: Diagnosis not present

## 2020-10-15 DIAGNOSIS — Z20822 Contact with and (suspected) exposure to covid-19: Secondary | ICD-10-CM | POA: Diagnosis not present

## 2020-10-15 DIAGNOSIS — Z3A00 Weeks of gestation of pregnancy not specified: Secondary | ICD-10-CM | POA: Diagnosis not present

## 2020-10-15 DIAGNOSIS — O26893 Other specified pregnancy related conditions, third trimester: Secondary | ICD-10-CM | POA: Diagnosis not present

## 2020-10-15 DIAGNOSIS — O480 Post-term pregnancy: Secondary | ICD-10-CM | POA: Diagnosis not present

## 2020-10-15 DIAGNOSIS — D649 Anemia, unspecified: Secondary | ICD-10-CM | POA: Diagnosis not present

## 2020-10-15 DIAGNOSIS — O9902 Anemia complicating childbirth: Secondary | ICD-10-CM | POA: Diagnosis not present

## 2020-10-15 LAB — SARS CORONAVIRUS 2 (TAT 6-24 HRS): SARS Coronavirus 2: NEGATIVE

## 2020-10-15 NOTE — H&P (View-Only) (Signed)
Hannah Walton is a 39 y.o. female G1P0 at 40 0/7 weeks (EDD 10/16/20 by 12week US) presenting for IOL a  term.  Prenatal care significant for: 1)  Advanced maternal age gravida  declined all genetic screenings 2)Maternal obesity  BMI 37 3)  S>D  u/s 12/7 : growth 73%'ile, Nml AFI, anterior placenta, Vtx. OB History    Gravida  1   Para      Term      Preterm      AB      Living        SAB      IAB      Ectopic      Multiple      Live Births             Past Medical History:  Diagnosis Date  . Cyst of knee joint 10/2014   left  . Cyst of left knee joint 10/27/2014  . History of gastric ulcer   . Sinus headache   . Synovitis of knee 10/2014   left   Past Surgical History:  Procedure Laterality Date  . KNEE ARTHROSCOPY Left 10/27/2014   Procedure: LEFT KNEE SCOPE SYNOVECTOMY;  Surgeon: Joshua P Landau, MD;  Location: [facility];  Service: Orthopedics;  Laterality: Left;  . MASS EXCISION Left 10/27/2014   Procedure: LEFT PCL CYST EXCISION ;  Surgeon: Joshua P Landau, MD;  Location: [facility];  Service: Orthopedics;  Laterality: Left;  . UPPER GI ENDOSCOPY     Family History: family history includes Arthritis in her mother and paternal grandmother; Diabetes in her maternal grandmother and paternal uncle; Stroke in her father and paternal uncle. Social History:  reports that she has never smoked. She has never used smokeless tobacco. She reports that she does not drink alcohol and does not use drugs.     Maternal Diabetes: No Genetic Screening: Declined Maternal Ultrasounds/Referrals: Normal Fetal Ultrasounds or other Referrals:  None Maternal Substance Abuse:  No Significant Maternal Medications:  None Significant Maternal Lab Results:  Group B Strep negative Other Comments:  None  Review of Systems  Constitutional: Negative for fever.  Gastrointestinal: Negative for abdominal pain.   Maternal Medical History:   Contractions: Frequency: irregular.   Perceived severity is mild.    Fetal activity: Perceived fetal activity is normal.    Prenatal complications: AMA  Prenatal Complications - Diabetes: none.      There were no vitals taken for this visit. Maternal Exam:  Uterine Assessment: Contraction strength is mild.  Contraction frequency is irregular.   Abdomen: Patient reports no abdominal tenderness. Fetal presentation: vertex  Introitus: Normal vulva. Normal vagina.    Physical Exam Cardiovascular:     Rate and Rhythm: Normal rate and regular rhythm.  Pulmonary:     Effort: Pulmonary effort is normal.  Abdominal:     Palpations: Abdomen is soft.  Genitourinary:    General: Normal vulva.  Neurological:     Mental Status: She is alert.  Psychiatric:        Mood and Affect: Mood normal.     Prenatal labs: ABO, Rh:  O positive Antibody:  negative Rubella:  Immune RPR:   NR HBsAg:  Neg  HIV:   NR GBS:   Neg One hour GCT 131 Hgb AA  Assessment/Plan: Pt for IOL at term.  Plan cytotec and then pitocin AROM.    Mikale Silversmith W Brandace Cargle 10/15/2020, 5:03 PM    

## 2020-10-15 NOTE — H&P (Signed)
Hannah Walton is a 39 y.o. female G1P0 at 72 0/7 weeks (EDD 10/16/20 by 12week [place]) presenting for IOL a  term.  Prenatal care significant for: 1)  Advanced maternal age gravida  declined all genetic screenings 2)Maternal obesity  BMI 37 3)  S>D  u/s 12/7 : growth 73%'ile, Nml AFI, anterior placenta, Vtx. OB History    Gravida  1   Para      Term      Preterm      AB      Living        SAB      IAB      Ectopic      Multiple      Live Births             Past Medical History:  Diagnosis Date  . Cyst of knee joint 10/2014   left  . Cyst of left knee joint 10/27/2014  . History of gastric ulcer   . Sinus headache   . Synovitis of knee 10/2014   left   Past Surgical History:  Procedure Laterality Date  . KNEE ARTHROSCOPY Left 10/27/2014   Procedure: LEFT KNEE SCOPE SYNOVECTOMY;  Surgeon: Eulas Post, MD;  Location: [facility];  Service: Orthopedics;  Laterality: Left;  Marland Kitchen MASS EXCISION Left 10/27/2014   Procedure: LEFT PCL CYST EXCISION ;  Surgeon: Eulas Post, MD;  Location: [facility];  Service: Orthopedics;  Laterality: Left;  . UPPER GI ENDOSCOPY     Family History: family history includes Arthritis in her mother and paternal grandmother; Diabetes in her maternal grandmother and paternal uncle; Stroke in her father and paternal uncle. Social History:  reports that she has never smoked. She has never used smokeless tobacco. She reports that she does not drink alcohol and does not use drugs.     Maternal Diabetes: No Genetic Screening: Declined Maternal Ultrasounds/Referrals: Normal Fetal Ultrasounds or other Referrals:  None Maternal Substance Abuse:  No Significant Maternal Medications:  None Significant Maternal Lab Results:  Group B Strep negative Other Comments:  None  Review of Systems  Constitutional: Negative for fever.  Gastrointestinal: Negative for abdominal pain.   Maternal Medical History:   Contractions: Frequency: irregular.   Perceived severity is mild.    Fetal activity: Perceived fetal activity is normal.    Prenatal complications: AMA  Prenatal Complications - Diabetes: none.      There were no vitals taken for this visit. Maternal Exam:  Uterine Assessment: Contraction strength is mild.  Contraction frequency is irregular.   Abdomen: Patient reports no abdominal tenderness. Fetal presentation: vertex  Introitus: Normal vulva. Normal vagina.    Physical Exam Cardiovascular:     Rate and Rhythm: Normal rate and regular rhythm.  Pulmonary:     Effort: Pulmonary effort is normal.  Abdominal:     Palpations: Abdomen is soft.  Genitourinary:    General: Normal vulva.  Neurological:     Mental Status: She is alert.  Psychiatric:        Mood and Affect: Mood normal.     Prenatal labs: ABO, Rh:  O positive Antibody:  negative Rubella:  Immune RPR:   NR HBsAg:  Neg  HIV:   NR GBS:   Neg One hour GCT 131 Hgb AA  Assessment/Plan: Pt for IOL at term.  Plan cytotec and then pitocin AROM.    Hannah Walton 10/15/2020, 5:03 PM

## 2020-10-16 ENCOUNTER — Inpatient Hospital Stay (HOSPITAL_COMMUNITY): Payer: BC Managed Care – PPO

## 2020-10-16 ENCOUNTER — Inpatient Hospital Stay (HOSPITAL_COMMUNITY)
Admission: AD | Admit: 2020-10-16 | Discharge: 2020-10-20 | DRG: 788 | Disposition: A | Discharge status: Home / Self Care | Payer: BC Managed Care – PPO | Attending: Obstetrics and Gynecology | Admitting: Obstetrics and Gynecology

## 2020-10-16 ENCOUNTER — Other Ambulatory Visit: Payer: Self-pay

## 2020-10-16 ENCOUNTER — Encounter (HOSPITAL_COMMUNITY): Payer: Self-pay | Admitting: Obstetrics and Gynecology

## 2020-10-16 DIAGNOSIS — Z98891 History of uterine scar from previous surgery: Secondary | ICD-10-CM

## 2020-10-16 DIAGNOSIS — O26893 Other specified pregnancy related conditions, third trimester: Secondary | ICD-10-CM | POA: Diagnosis present

## 2020-10-16 DIAGNOSIS — Z3A40 40 weeks gestation of pregnancy: Secondary | ICD-10-CM

## 2020-10-16 DIAGNOSIS — Z20822 Contact with and (suspected) exposure to covid-19: Secondary | ICD-10-CM | POA: Diagnosis present

## 2020-10-16 DIAGNOSIS — O480 Post-term pregnancy: Secondary | ICD-10-CM | POA: Diagnosis present

## 2020-10-16 DIAGNOSIS — O99214 Obesity complicating childbirth: Secondary | ICD-10-CM | POA: Diagnosis present

## 2020-10-16 LAB — CBC
HCT: 35.3 % — ABNORMAL LOW (ref 36.0–46.0)
Hemoglobin: 11.1 g/dL — ABNORMAL LOW (ref 12.0–15.0)
MCH: 27.3 pg (ref 26.0–34.0)
MCHC: 31.4 g/dL (ref 30.0–36.0)
MCV: 86.9 fL (ref 80.0–100.0)
Platelets: 158 10*3/uL (ref 150–400)
RBC: 4.06 MIL/uL (ref 3.87–5.11)
RDW: 13.9 % (ref 11.5–15.5)
WBC: 8.8 10*3/uL (ref 4.0–10.5)
nRBC: 0 % (ref 0.0–0.2)

## 2020-10-16 LAB — TYPE AND SCREEN
ABO/RH(D): O POS
Antibody Screen: NEGATIVE

## 2020-10-16 MED ORDER — SOD CITRATE-CITRIC ACID 500-334 MG/5ML PO SOLN
30.0000 mL | ORAL | Status: DC | PRN
  Administered 2020-10-18: 30 mL via ORAL
  Filled 2020-10-16: qty 15

## 2020-10-16 MED ORDER — LACTATED RINGERS IV SOLN: INTRAVENOUS | Status: DC

## 2020-10-16 MED ORDER — OXYTOCIN-SODIUM CHLORIDE 30-0.9 UT/500ML-% IV SOLN
1.0000 m[IU]/min | INTRAVENOUS | Status: DC
  Administered 2020-10-16 – 2020-10-17 (×2): 2 m[IU]/min via INTRAVENOUS
  Filled 2020-10-16: qty 500

## 2020-10-16 MED ORDER — MISOPROSTOL 25 MCG QUARTER TABLET
25.0000 ug | ORAL_TABLET | Freq: Once | ORAL | Status: AC
  Administered 2020-10-16: 25 ug via VAGINAL
  Filled 2020-10-16: qty 1

## 2020-10-16 MED ORDER — OXYTOCIN-SODIUM CHLORIDE 30-0.9 UT/500ML-% IV SOLN: 1.0000 m[IU]/min | INTRAVENOUS | Status: DC

## 2020-10-16 MED ORDER — OXYTOCIN-SODIUM CHLORIDE 30-0.9 UT/500ML-% IV SOLN: 2.5000 [IU]/h | INTRAVENOUS | Status: DC

## 2020-10-16 MED ORDER — LACTATED RINGERS IV SOLN
500.0000 mL | INTRAVENOUS | Status: DC | PRN
  Administered 2020-10-17: 500 mL via INTRAVENOUS

## 2020-10-16 MED ORDER — TERBUTALINE SULFATE 1 MG/ML IJ SOLN: 0.2500 mg | Freq: Once | INTRAMUSCULAR | Status: DC | PRN

## 2020-10-16 MED ORDER — OXYCODONE-ACETAMINOPHEN 5-325 MG PO TABS: 2.0000 | ORAL_TABLET | ORAL | Status: DC | PRN

## 2020-10-16 MED ORDER — ONDANSETRON HCL 4 MG/2ML IJ SOLN: 4.0000 mg | Freq: Four times a day (QID) | INTRAMUSCULAR | Status: DC | PRN

## 2020-10-16 MED ORDER — ACETAMINOPHEN 325 MG PO TABS: 650.0000 mg | ORAL_TABLET | ORAL | Status: DC | PRN

## 2020-10-16 MED ORDER — OXYCODONE-ACETAMINOPHEN 5-325 MG PO TABS: 1.0000 | ORAL_TABLET | ORAL | Status: DC | PRN

## 2020-10-16 MED ORDER — BUTORPHANOL TARTRATE 1 MG/ML IJ SOLN: 1.0000 mg | INTRAMUSCULAR | Status: DC | PRN

## 2020-10-16 MED ORDER — LIDOCAINE HCL (PF) 1 % IJ SOLN: 30.0000 mL | INTRAMUSCULAR | Status: DC | PRN

## 2020-10-16 MED ORDER — OXYTOCIN BOLUS FROM INFUSION: 333.0000 mL | Freq: Once | INTRAVENOUS | Status: DC

## 2020-10-16 NOTE — Interval H&P Note (Signed)
History and Physical Interval Note:  10/16/2020 7:37 PM  Pt admitted and received cytotec x 1 at 400pm.  Feeling mild contractions every 2-3 minutes FHR category 1 Cervix 70/2/-2 anterior AROM clear fluid Start pitocin at 800pm per protocol  Oliver Pila

## 2020-10-17 ENCOUNTER — Encounter (HOSPITAL_COMMUNITY)
Admission: AD | Disposition: A | Discharge status: Home / Self Care | Payer: Self-pay | Source: Home / Self Care | Attending: Obstetrics and Gynecology

## 2020-10-17 ENCOUNTER — Inpatient Hospital Stay (HOSPITAL_COMMUNITY): Payer: BC Managed Care – PPO | Admitting: Anesthesiology

## 2020-10-17 LAB — RPR: RPR Ser Ql: NONREACTIVE

## 2020-10-17 SURGERY — Surgical Case
Anesthesia: Epidural | Wound class: Clean Contaminated

## 2020-10-17 MED ORDER — PHENYLEPHRINE 40 MCG/ML (10ML) SYRINGE FOR IV PUSH (FOR BLOOD PRESSURE SUPPORT)
80.0000 ug | PREFILLED_SYRINGE | INTRAVENOUS | Status: DC | PRN
  Administered 2020-10-17: 80 ug via INTRAVENOUS

## 2020-10-17 MED ORDER — EPHEDRINE 5 MG/ML INJ: 10.0000 mg | INTRAVENOUS | Status: DC | PRN

## 2020-10-17 MED ORDER — PHENYLEPHRINE 40 MCG/ML (10ML) SYRINGE FOR IV PUSH (FOR BLOOD PRESSURE SUPPORT)
80.0000 ug | PREFILLED_SYRINGE | INTRAVENOUS | Status: DC | PRN
  Administered 2020-10-17: 80 ug via INTRAVENOUS
  Filled 2020-10-17: qty 10

## 2020-10-17 MED ORDER — LACTATED RINGERS IV SOLN
500.0000 mL | Freq: Once | INTRAVENOUS | Status: AC
  Administered 2020-10-17: 500 mL via INTRAVENOUS

## 2020-10-17 MED ORDER — LIDOCAINE HCL (PF) 1 % IJ SOLN
INTRAMUSCULAR | Status: DC | PRN
  Administered 2020-10-17 (×2): 5 mL via EPIDURAL

## 2020-10-17 MED ORDER — FENTANYL-BUPIVACAINE-NACL 0.5-0.125-0.9 MG/250ML-% EP SOLN
12.0000 mL/h | EPIDURAL | Status: DC | PRN
  Administered 2020-10-17: 12 mL/h via EPIDURAL
  Filled 2020-10-17 (×2): qty 250

## 2020-10-17 MED ORDER — DIPHENHYDRAMINE HCL 50 MG/ML IJ SOLN: 12.5000 mg | INTRAMUSCULAR | Status: DC | PRN

## 2020-10-17 MED ORDER — LACTATED RINGERS AMNIOINFUSION: INTRAVENOUS | Status: DC

## 2020-10-17 MED ORDER — DIPHENHYDRAMINE HCL 50 MG/ML IJ SOLN
25.0000 mg | Freq: Once | INTRAMUSCULAR | Status: AC
  Administered 2020-10-17: 25 mg via INTRAVENOUS
  Filled 2020-10-17: qty 1

## 2020-10-17 MED ORDER — SODIUM CHLORIDE (PF) 0.9 % IJ SOLN
INTRAMUSCULAR | Status: DC | PRN
  Administered 2020-10-17: 12 mL/h via EPIDURAL

## 2020-10-17 SURGICAL SUPPLY — 41 items
APL SKNCLS STERI-STRIP NONHPOA (GAUZE/BANDAGES/DRESSINGS) ×1
BENZOIN TINCTURE PRP APPL 2/3 (GAUZE/BANDAGES/DRESSINGS) ×1 IMPLANT
CHLORAPREP W/TINT 26ML (MISCELLANEOUS) ×2 IMPLANT
CLAMP CORD UMBIL (MISCELLANEOUS) IMPLANT
CLOTH BEACON ORANGE TIMEOUT ST (SAFETY) ×2 IMPLANT
DRAPE C SECTION CLR SCREEN (DRAPES) ×2 IMPLANT
DRSG OPSITE POSTOP 4X10 (GAUZE/BANDAGES/DRESSINGS) ×2 IMPLANT
ELECT REM PT RETURN 9FT ADLT (ELECTROSURGICAL) ×2
ELECTRODE REM PT RTRN 9FT ADLT (ELECTROSURGICAL) ×1 IMPLANT
EXTRACTOR VACUUM KIWI (MISCELLANEOUS) IMPLANT
GLOVE BIO SURGEON STRL SZ 6.5 (GLOVE) ×2 IMPLANT
GLOVE BIOGEL PI IND STRL 7.0 (GLOVE) ×2 IMPLANT
GLOVE BIOGEL PI INDICATOR 7.0 (GLOVE) ×2
GOWN STRL REUS W/TWL LRG LVL3 (GOWN DISPOSABLE) ×4 IMPLANT
KIT ABG SYR 3ML LUER SLIP (SYRINGE) IMPLANT
NDL HYPO 25X5/8 SAFETYGLIDE (NEEDLE) IMPLANT
NEEDLE HYPO 25X5/8 SAFETYGLIDE (NEEDLE) IMPLANT
NS IRRIG 1000ML POUR BTL (IV SOLUTION) ×2 IMPLANT
PACK C SECTION WH (CUSTOM PROCEDURE TRAY) ×2 IMPLANT
PAD OB MATERNITY 4.3X12.25 (PERSONAL CARE ITEMS) ×2 IMPLANT
RETAINER VISCERAL (MISCELLANEOUS) ×1 IMPLANT
RETRACTOR WND ALEXIS 25 LRG (MISCELLANEOUS) ×1 IMPLANT
RTRCTR C-SECT PINK 25CM LRG (MISCELLANEOUS) IMPLANT
RTRCTR WOUND ALEXIS 25CM LRG (MISCELLANEOUS) ×2
SPONGE LAP 18X18 X RAY DECT (DISPOSABLE) ×1 IMPLANT
STRIP CLOSURE SKIN 1/2X4 (GAUZE/BANDAGES/DRESSINGS) ×1 IMPLANT
SUT CHROMIC 1 CTX 36 (SUTURE) ×4 IMPLANT
SUT PLAIN 0 NONE (SUTURE) IMPLANT
SUT PLAIN 2 0 XLH (SUTURE) ×2 IMPLANT
SUT VIC AB 0 CT1 27 (SUTURE) ×4
SUT VIC AB 0 CT1 27XBRD ANBCTR (SUTURE) ×2 IMPLANT
SUT VIC AB 2-0 CT1 27 (SUTURE) ×2
SUT VIC AB 2-0 CT1 TAPERPNT 27 (SUTURE) ×1 IMPLANT
SUT VIC AB 3-0 CT1 27 (SUTURE)
SUT VIC AB 3-0 CT1 TAPERPNT 27 (SUTURE) IMPLANT
SUT VIC AB 3-0 SH 27 (SUTURE) ×2
SUT VIC AB 3-0 SH 27X BRD (SUTURE) IMPLANT
SUT VIC AB 4-0 KS 27 (SUTURE) ×2 IMPLANT
TOWEL OR 17X24 6PK STRL BLUE (TOWEL DISPOSABLE) ×2 IMPLANT
TRAY FOLEY W/BAG SLVR 14FR LF (SET/KITS/TRAYS/PACK) ×2 IMPLANT
WATER STERILE IRR 1000ML POUR (IV SOLUTION) ×3 IMPLANT

## 2020-10-17 NOTE — Progress Notes (Signed)
Patient ID: Hannah Walton, female   DOB: 1981-10-17, 39 y.o.   MRN: [id number] Pt comfortable with epidural. No complaints VSS GEN - NAD EFM- Cat 1, 120 TOCO - ctxs q 2-47mins SVE - 6/90/-1 Pit at  A/P: Progressing well in labor on pitocin; s/p amnioinfusion after epidural         Continue with expectant mgmt          Anticipate svd

## 2020-10-17 NOTE — Progress Notes (Signed)
Patient ID: Hannah Walton, female   DOB: Nov 25, 1980, 39 y.o.   MRN: [id number] Pt got uncomfortable and received epidural, feels better now  At 230am the FHR began to have some mild decelerations but is hard to tell if with contractions or lates, had good variability.  Pt then sat up for epidural for about 20 minutes off the monitor and I came in and applied FSE and IUPC just after epidural.  FHR now with good variability, but persistent mild late decelerations.  Position changed and amnioinfusion begun.  If does not resolve the lates will hold pitocin for a bit.  Still has accels and good variability, suspect baby is OP.  80/3-4/-1  Following closely

## 2020-10-17 NOTE — Anesthesia Preprocedure Evaluation (Signed)
Anesthesia Evaluation  Patient identified by MRN, date of birth, ID band Patient awake    Reviewed: Allergy & Precautions, H&P , NPO status , Patient's Chart, lab work & pertinent test results  History of Anesthesia Complications Negative for: history of anesthetic complications  Airway Mallampati: II  TM Distance: >3 FB Neck ROM: full    Dental no notable dental hx. (+) Teeth Intact   Pulmonary neg pulmonary ROS,    Pulmonary exam normal breath sounds clear to auscultation       Cardiovascular negative cardio ROS Normal cardiovascular exam Rhythm:regular Rate:Normal     Neuro/Psych  Headaches, negative psych ROS   GI/Hepatic negative GI ROS, Neg liver ROS,   Endo/Other  Morbid obesity  Renal/GU negative Renal ROS  negative genitourinary   Musculoskeletal   Abdominal (+) + obese,   Peds  Hematology  (+) Blood dyscrasia, anemia ,   Anesthesia Other Findings   Reproductive/Obstetrics (+) Pregnancy                             Anesthesia Physical Anesthesia Plan  ASA: III  Anesthesia Plan: Epidural   Post-op Pain Management:    Induction:   PONV Risk Score and Plan:   Airway Management Planned:   Additional Equipment:   Intra-op Plan:   Post-operative Plan:   Informed Consent: I have reviewed the patients History and Physical, chart, labs and discussed the procedure including the risks, benefits and alternatives for the proposed anesthesia with the patient or authorized representative who has indicated his/her understanding and acceptance.       Plan Discussed with:   Anesthesia Plan Comments:         Anesthesia Quick Evaluation  

## 2020-10-17 NOTE — Progress Notes (Signed)
Patient ID: Hannah Walton, female   DOB: 1981/08/22, 39 y.o.   MRN: [id number] The amnioinfusion did not completely resolve the fetal heart rate decelerations so pitocin was turned off and left off while I attended another delivery. It has been off about 2 hours and FHR is back to a category 1 tracing.  Contractions have spaced out so pitocin will be restarted.  80/4/-1   D/w pt plan and that we would see how the baby responds to restarting pitocin Amnioinfusion off.

## 2020-10-17 NOTE — Progress Notes (Signed)
Patient ID: Hannah Walton, female   DOB: [birth year], 39 y.o.   MRN: [id number] MVus now at 180-190 with pitocin at  Pt comfortable; afebrile  Cat 1, 150  Plan - continue to titrate till ,vus 220           Expectant management           Discussed indications for c/s

## 2020-10-17 NOTE — Progress Notes (Signed)
Patient ID: Hannah Walton, female   DOB: 1980-11-06, 39 y.o.   MRN: [id number] Pt with no new complaints. Feels well VSS GEN - NAD EFM - 150s, cat 1 TOCO - ctxs q 1-57mins. MVUs 130s SVE - 6.5/80/-1  A/P: Prime at 40 1/7wks, IOL, on pitocin at - inadequate ctxs          IUPC replaced          Continue with expectant mgmt

## 2020-10-17 NOTE — Anesthesia Procedure Notes (Signed)
Epidural Patient location during procedure: OB Start time: 10/17/2020 3:27 AM End time: 10/17/2020 3:37 AM  Staffing Anesthesiologist: Leonides Grills, MD Performed: anesthesiologist   Preanesthetic Checklist Completed: patient identified, IV checked, site marked, risks and benefits discussed, monitors and equipment checked, pre-op evaluation and timeout performed  Epidural Patient position: sitting Prep: DuraPrep Patient monitoring: heart rate, cardiac monitor, continuous pulse ox and blood pressure Approach: midline Location: L3-L4 Injection technique: LOR air  Needle:  Needle type: Tuohy  Needle gauge: 17 G Needle length: 9 cm Needle insertion depth: 9 cm Catheter type: closed end flexible Catheter size: 19 Gauge Catheter at skin depth: 15 cm Test dose: negative and Other  Assessment Events: blood not aspirated, injection not painful, no injection resistance and negative IV test  Additional Notes Informed consent obtained prior to proceeding including risk of failure, 1% risk of PDPH, risk of minor discomfort and bruising. Discussed alternatives to epidural analgesia and patient desires to proceed.  Timeout performed pre-procedure verifying patient name, procedure, and platelet count.  Patient tolerated procedure well. Reason for block:procedure for pain

## 2020-10-17 NOTE — Progress Notes (Signed)
Patient ID: Hannah Walton, female   DOB: 07-26-1981, 39 y.o.   MRN: [id number] Pt was on pitocin up to but still had inadequate mvus  Pitocin was stopped for a little over then restarted at .   Pitocin now at with continued irregular pattern in timing; still inadequate  Cervix 7/80/-1  Discussed with pt her prolonged course and arrest of dilation.  Discussed options to effect delivery including cesarean section - pt and family in agreement with delivery via this route.  Risks/benefits reviewed Anesthesia and staff notified. Pt consented for procedure  -Stop pitocin  -Prep for OR -ERAS protocol -Ancef 2gm

## 2020-10-18 ENCOUNTER — Encounter (HOSPITAL_COMMUNITY): Payer: Self-pay | Admitting: Obstetrics and Gynecology

## 2020-10-18 DIAGNOSIS — Z98891 History of uterine scar from previous surgery: Secondary | ICD-10-CM

## 2020-10-18 LAB — CREATININE, SERUM
Creatinine, Ser: 1.53 mg/dL — ABNORMAL HIGH (ref 0.44–1.00)
GFR, Estimated: 44 mL/min — ABNORMAL LOW (ref >60)

## 2020-10-18 LAB — CBC
HCT: 29.5 % — ABNORMAL LOW (ref 36.0–46.0)
Hemoglobin: 10.2 g/dL — ABNORMAL LOW (ref 12.0–15.0)
MCH: 28.7 pg (ref 26.0–34.0)
MCHC: 34.6 g/dL (ref 30.0–36.0)
MCV: 82.9 fL (ref 80.0–100.0)
Platelets: 134 10*3/uL — ABNORMAL LOW (ref 150–400)
RBC: 3.56 MIL/uL — ABNORMAL LOW (ref 3.87–5.11)
RDW: 14 % (ref 11.5–15.5)
WBC: 17.4 10*3/uL — ABNORMAL HIGH (ref 4.0–10.5)
nRBC: 0 % (ref 0.0–0.2)

## 2020-10-18 MED ORDER — CEFAZOLIN SODIUM-DEXTROSE 2-4 GM/100ML-% IV SOLN
INTRAVENOUS | Status: AC
  Filled 2020-10-18: qty 100

## 2020-10-18 MED ORDER — METHYLERGONOVINE MALEATE 0.2 MG/ML IJ SOLN
INTRAMUSCULAR | Status: DC | PRN
  Administered 2020-10-18: .2 mg via INTRAMUSCULAR

## 2020-10-18 MED ORDER — DIBUCAINE (PERIANAL) 1 % EX OINT: 1.0000 "application " | TOPICAL_OINTMENT | CUTANEOUS | Status: DC | PRN

## 2020-10-18 MED ORDER — ACETAMINOPHEN 10 MG/ML IV SOLN: 1000.0000 mg | Freq: Once | INTRAVENOUS | Status: DC | PRN

## 2020-10-18 MED ORDER — DIPHENHYDRAMINE HCL 25 MG PO CAPS: 25.0000 mg | ORAL_CAPSULE | Freq: Four times a day (QID) | ORAL | Status: DC | PRN

## 2020-10-18 MED ORDER — SCOPOLAMINE 1 MG/3DAYS TD PT72
MEDICATED_PATCH | TRANSDERMAL | Status: DC | PRN
  Administered 2020-10-18: 1 via TRANSDERMAL

## 2020-10-18 MED ORDER — LACTATED RINGERS IV SOLN: INTRAVENOUS | Status: DC

## 2020-10-18 MED ORDER — SODIUM CHLORIDE 0.9% FLUSH: 3.0000 mL | INTRAVENOUS | Status: DC | PRN

## 2020-10-18 MED ORDER — ONDANSETRON HCL 4 MG/2ML IJ SOLN: 4.0000 mg | Freq: Three times a day (TID) | INTRAMUSCULAR | Status: DC | PRN

## 2020-10-18 MED ORDER — COCONUT OIL OIL
1.0000 "application " | TOPICAL_OIL | Status: DC | PRN
  Administered 2020-10-18: 1 via TOPICAL

## 2020-10-18 MED ORDER — POVIDONE-IODINE 10 % EX SWAB
2.0000 | Freq: Once | CUTANEOUS | Status: DC
Start: 2020-10-18 — End: 2020-10-18

## 2020-10-18 MED ORDER — FENTANYL CITRATE (PF) 100 MCG/2ML IJ SOLN
INTRAMUSCULAR | Status: DC | PRN
  Administered 2020-10-18: 100 ug via INTRAVENOUS

## 2020-10-18 MED ORDER — FENTANYL CITRATE (PF) 100 MCG/2ML IJ SOLN: 25.0000 ug | INTRAMUSCULAR | Status: DC | PRN

## 2020-10-18 MED ORDER — METHYLERGONOVINE MALEATE 0.2 MG/ML IJ SOLN
INTRAMUSCULAR | Status: AC
  Filled 2020-10-18: qty 1

## 2020-10-18 MED ORDER — ONDANSETRON HCL 4 MG/2ML IJ SOLN
INTRAMUSCULAR | Status: AC
  Filled 2020-10-18: qty 2

## 2020-10-18 MED ORDER — PROMETHAZINE HCL 25 MG/ML IJ SOLN: 6.2500 mg | INTRAMUSCULAR | Status: DC | PRN

## 2020-10-18 MED ORDER — ZOLPIDEM TARTRATE 5 MG PO TABS
5.0000 mg | ORAL_TABLET | Freq: Every evening | ORAL | Status: DC | PRN
Start: 2020-10-18 — End: 2020-10-20

## 2020-10-18 MED ORDER — ENOXAPARIN SODIUM 60 MG/0.6ML ~~LOC~~ SOLN
0.5000 mg/kg | SUBCUTANEOUS | Status: DC
  Administered 2020-10-18: 52.5 mg via SUBCUTANEOUS
  Filled 2020-10-18: qty 0.6

## 2020-10-18 MED ORDER — NALOXONE HCL 0.4 MG/ML IJ SOLN: 0.4000 mg | INTRAMUSCULAR | Status: DC | PRN

## 2020-10-18 MED ORDER — OXYCODONE HCL 5 MG PO TABS
5.0000 mg | ORAL_TABLET | ORAL | Status: DC | PRN
  Administered 2020-10-19: 5 mg via ORAL
  Filled 2020-10-18: qty 1

## 2020-10-18 MED ORDER — TETANUS-DIPHTH-ACELL PERTUSSIS 5-2.5-18.5 LF-MCG/0.5 IM SUSY: 0.5000 mL | PREFILLED_SYRINGE | Freq: Once | INTRAMUSCULAR | Status: DC

## 2020-10-18 MED ORDER — WITCH HAZEL-GLYCERIN EX PADS: 1.0000 "application " | MEDICATED_PAD | CUTANEOUS | Status: DC | PRN

## 2020-10-18 MED ORDER — OXYCODONE HCL 5 MG/5ML PO SOLN
5.0000 mg | Freq: Once | ORAL | Status: DC | PRN
Start: 2020-10-18 — End: 2020-10-18

## 2020-10-18 MED ORDER — SENNOSIDES-DOCUSATE SODIUM 8.6-50 MG PO TABS
2.0000 | ORAL_TABLET | Freq: Every day | ORAL | Status: DC
  Administered 2020-10-19 – 2020-10-20 (×2): 2 via ORAL
  Filled 2020-10-18 (×3): qty 2

## 2020-10-18 MED ORDER — SIMETHICONE 80 MG PO CHEW
80.0000 mg | CHEWABLE_TABLET | Freq: Three times a day (TID) | ORAL | Status: DC
  Administered 2020-10-18 – 2020-10-20 (×7): 80 mg via ORAL
  Filled 2020-10-18 (×8): qty 1

## 2020-10-18 MED ORDER — SODIUM CHLORIDE 0.9 % IV SOLN: INTRAVENOUS | Status: DC | PRN

## 2020-10-18 MED ORDER — OXYCODONE HCL 5 MG PO TABS: 5.0000 mg | ORAL_TABLET | Freq: Once | ORAL | Status: DC | PRN

## 2020-10-18 MED ORDER — CEFAZOLIN SODIUM-DEXTROSE 2-3 GM-%(50ML) IV SOLR
INTRAVENOUS | Status: DC | PRN
  Administered 2020-10-18: 2 g via INTRAVENOUS

## 2020-10-18 MED ORDER — OXYTOCIN-SODIUM CHLORIDE 30-0.9 UT/500ML-% IV SOLN
INTRAVENOUS | Status: DC | PRN
  Administered 2020-10-18: 30 [IU] via INTRAVENOUS

## 2020-10-18 MED ORDER — MISOPROSTOL 200 MCG PO TABS: 800.0000 ug | ORAL_TABLET | Freq: Once | ORAL | Status: DC

## 2020-10-18 MED ORDER — SCOPOLAMINE 1 MG/3DAYS TD PT72: 1.0000 | MEDICATED_PATCH | Freq: Once | TRANSDERMAL | Status: DC

## 2020-10-18 MED ORDER — KETOROLAC TROMETHAMINE 30 MG/ML IJ SOLN: 30.0000 mg | Freq: Once | INTRAMUSCULAR | Status: DC

## 2020-10-18 MED ORDER — NALBUPHINE HCL 10 MG/ML IJ SOLN: 5.0000 mg | Freq: Once | INTRAMUSCULAR | Status: DC | PRN

## 2020-10-18 MED ORDER — DIPHENHYDRAMINE HCL 50 MG/ML IJ SOLN: 12.5000 mg | Freq: Four times a day (QID) | INTRAMUSCULAR | Status: DC | PRN

## 2020-10-18 MED ORDER — SIMETHICONE 80 MG PO CHEW
80.0000 mg | CHEWABLE_TABLET | ORAL | Status: DC | PRN
  Administered 2020-10-19: 80 mg via ORAL
  Filled 2020-10-18: qty 1

## 2020-10-18 MED ORDER — IBUPROFEN 800 MG PO TABS
800.0000 mg | ORAL_TABLET | Freq: Three times a day (TID) | ORAL | Status: DC
  Administered 2020-10-18 – 2020-10-20 (×7): 800 mg via ORAL
  Filled 2020-10-18 (×7): qty 1

## 2020-10-18 MED ORDER — MEPERIDINE HCL 25 MG/ML IJ SOLN: 6.2500 mg | INTRAMUSCULAR | Status: DC | PRN

## 2020-10-18 MED ORDER — SOD CITRATE-CITRIC ACID 500-334 MG/5ML PO SOLN: 30.0000 mL | ORAL | Status: DC

## 2020-10-18 MED ORDER — PHENYLEPHRINE 40 MCG/ML (10ML) SYRINGE FOR IV PUSH (FOR BLOOD PRESSURE SUPPORT)
PREFILLED_SYRINGE | INTRAVENOUS | Status: AC
  Filled 2020-10-18: qty 10

## 2020-10-18 MED ORDER — NALBUPHINE HCL 10 MG/ML IJ SOLN: 5.0000 mg | INTRAMUSCULAR | Status: DC | PRN

## 2020-10-18 MED ORDER — MORPHINE SULFATE (PF) 0.5 MG/ML IJ SOLN
INTRAMUSCULAR | Status: AC
  Filled 2020-10-18: qty 10

## 2020-10-18 MED ORDER — SCOPOLAMINE 1 MG/3DAYS TD PT72
MEDICATED_PATCH | TRANSDERMAL | Status: AC
  Filled 2020-10-18: qty 1

## 2020-10-18 MED ORDER — MENTHOL 3 MG MT LOZG: 1.0000 | LOZENGE | OROMUCOSAL | Status: DC | PRN

## 2020-10-18 MED ORDER — SODIUM CHLORIDE 0.9 % IR SOLN
Status: DC | PRN
  Administered 2020-10-18: 1

## 2020-10-18 MED ORDER — FENTANYL CITRATE (PF) 100 MCG/2ML IJ SOLN
INTRAMUSCULAR | Status: AC
  Filled 2020-10-18: qty 2

## 2020-10-18 MED ORDER — MORPHINE SULFATE (PF) 0.5 MG/ML IJ SOLN
INTRAMUSCULAR | Status: DC | PRN
  Administered 2020-10-18: 3 mg via EPIDURAL

## 2020-10-18 MED ORDER — DIPHENHYDRAMINE HCL 25 MG PO CAPS: 25.0000 mg | ORAL_CAPSULE | ORAL | Status: DC | PRN

## 2020-10-18 MED ORDER — NALOXONE HCL 4 MG/10ML IJ SOLN
1.0000 ug/kg/h | INTRAVENOUS | Status: DC | PRN
  Filled 2020-10-18: qty 5

## 2020-10-18 MED ORDER — LIDOCAINE-EPINEPHRINE (PF) 2 %-1:200000 IJ SOLN
INTRAMUSCULAR | Status: DC | PRN
  Administered 2020-10-18: 3 mL via EPIDURAL
  Administered 2020-10-18: 5 mL via EPIDURAL

## 2020-10-18 MED ORDER — PHENYLEPHRINE HCL (PRESSORS) 10 MG/ML IV SOLN
INTRAVENOUS | Status: DC | PRN
  Administered 2020-10-18 (×2): 80 ug via INTRAVENOUS

## 2020-10-18 MED ORDER — GABAPENTIN 100 MG PO CAPS
100.0000 mg | ORAL_CAPSULE | Freq: Two times a day (BID) | ORAL | Status: DC
  Administered 2020-10-18 – 2020-10-20 (×6): 100 mg via ORAL
  Filled 2020-10-18 (×6): qty 1

## 2020-10-18 MED ORDER — ACETAMINOPHEN 500 MG PO TABS
1000.0000 mg | ORAL_TABLET | Freq: Four times a day (QID) | ORAL | Status: DC
  Administered 2020-10-18 – 2020-10-20 (×9): 1000 mg via ORAL
  Filled 2020-10-18 (×10): qty 2

## 2020-10-18 MED ORDER — MISOPROSTOL 200 MCG PO TABS
ORAL_TABLET | ORAL | Status: AC
  Filled 2020-10-18: qty 4

## 2020-10-18 MED ORDER — OXYTOCIN-SODIUM CHLORIDE 30-0.9 UT/500ML-% IV SOLN: 2.5000 [IU]/h | INTRAVENOUS | Status: AC

## 2020-10-18 MED ORDER — ONDANSETRON HCL 4 MG/2ML IJ SOLN
INTRAMUSCULAR | Status: DC | PRN
  Administered 2020-10-18: 4 mg via INTRAVENOUS

## 2020-10-18 MED ORDER — DEXTROSE 5 % IV SOLN: 3.0000 g | INTRAVENOUS | Status: DC

## 2020-10-18 MED ORDER — ACETAMINOPHEN 500 MG PO TABS: 1000.0000 mg | ORAL_TABLET | ORAL | Status: DC

## 2020-10-18 MED ORDER — PRENATAL MULTIVITAMIN CH
1.0000 | ORAL_TABLET | Freq: Every day | ORAL | Status: DC
  Administered 2020-10-18 – 2020-10-20 (×3): 1 via ORAL
  Filled 2020-10-18 (×3): qty 1

## 2020-10-18 NOTE — Progress Notes (Signed)
POSTPARTUM POSTOP PROGRESS NOTE  POD #0  Subjective:  No acute events overnight.  Pt has not yet ambulated much 2/2 late csx at 0100, Foley still in place, no issues with PO so far. She denies nausea or vomiting.  Pain is well controlled.  She has not had flatus. She has not had bowel movement.  Lochia Minimal. Desires to eventually breastfeed. Wants circ for son, reviewed will be done tomorrow given time of delivery  Objective: Blood pressure 109/72, pulse 80, temperature 98.1 F (36.7 C), temperature source Oral, resp. rate 18, height 5\' 3"  (1.6 m), weight 105.2 kg, SpO2 98 %, unknown if currently breastfeeding.  Physical Exam:  General: alert, cooperative and no distress Lochia:normal flow Chest: CTAB Heart: RRR no m/r/g Abdomen: +BS, soft, nontender Uterine Fundus: firm, 2cm below umbilicus. Honeycomb dressing intact, neg drainage Extremities: trace edema, neg calf TTP BL, neg Homans BL  Recent Labs    10/16/20 1448 10/18/20 0514  HGB 11.1* 10.2*  HCT 35.3* 29.5*    Assessment/Plan:  ASSESSMENT: Hannah Walton is a 39 y.o. G1P1001 s/p PLTCS @ [redacted]w[redacted]d for arrest of dilation. PNC c/b AMA, obesity.   Breastfeeding, Lactation consult and Circumcision prior to discharge   LOS: 2 days

## 2020-10-18 NOTE — Anesthesia Postprocedure Evaluation (Signed)
Anesthesia Post Note  Patient: Hannah Walton  Procedure(s) Performed: CESAREAN SECTION (N/A )     Patient location during evaluation: PACU Anesthesia Type: Epidural Level of consciousness: awake and alert Pain management: pain level controlled Vital Signs Assessment: post-procedure vital signs reviewed and stable Respiratory status: spontaneous breathing, nonlabored ventilation and respiratory function stable Cardiovascular status: stable Postop Assessment: no headache, no backache and epidural receding Anesthetic complications: no   No complications documented.  Last Vitals:  Vitals:   10/18/20 0430 10/18/20 0520  BP: 121/75 (!) 95/51  Pulse: (!) 105 100  Resp: 18 16  Temp: 37.6 C 37.2 C  SpO2:      Last Pain:  Vitals:   10/18/20 0520  TempSrc: Axillary  PainSc:    Pain Goal: Patients Stated Pain Goal: 6 (10/16/20 1443)                 Catheryn Bacon Damarian Priola

## 2020-10-18 NOTE — Anesthesia Postprocedure Evaluation (Signed)
Anesthesia Post Note  Patient: Hannah Walton  Procedure(s) Performed: CESAREAN SECTION (N/A )     Patient location during evaluation: Mother Baby Anesthesia Type: Epidural Level of consciousness: awake and alert Pain management: pain level controlled Vital Signs Assessment: post-procedure vital signs reviewed and stable Respiratory status: spontaneous breathing, nonlabored ventilation and respiratory function stable Cardiovascular status: stable Postop Assessment: no headache, no backache and epidural receding Anesthetic complications: no   No complications documented.  Last Vitals:  Vitals:   10/18/20 0430 10/18/20 0520  BP: 121/75 (!) 95/51  Pulse: (!) 105 100  Resp: 18 16  Temp: 37.6 C 37.2 C  SpO2:      Last Pain:  Vitals:   10/18/20 0520  TempSrc: Axillary  PainSc:    Pain Goal: Patients Stated Pain Goal: 6 (10/16/20 1443)                 Catheryn Bacon Aarsh Fristoe

## 2020-10-18 NOTE — Transfer of Care (Signed)
Immediate Anesthesia Transfer of Care Note  Patient: Hannah Walton  Procedure(s) Performed: CESAREAN SECTION (N/A )  Patient Location: PACU  Anesthesia Type:Epidural  Level of Consciousness: awake, alert  and oriented  Airway & Oxygen Therapy: Patient Spontanous Breathing  Post-op Assessment: Report given to RN  Post vital signs: Reviewed and stable  Last Vitals:  Vitals Value Taken Time  BP 139/98 10/18/20 0201  Temp 36.8 C 10/18/20 0155  Pulse 96 10/18/20 0204  Resp 24 10/18/20 0204  SpO2 93 % 10/18/20 0204  Vitals shown include unvalidated device data.  Last Pain:  Vitals:   10/18/20 0155  TempSrc: Oral  PainSc:       Patients Stated Pain Goal: 6 (10/16/20 1443)  Complications: No complications documented.

## 2020-10-18 NOTE — Op Note (Addendum)
Operative Note    Preoperative Diagnosis 1. IUP at 40 2/7wks 2. Arrest of dilation   Postoperative Diagnosis: Same    Procedure: Primary low transverse cesarean section with double layered closure   Surgeon: Britt Bottom DO  Anesthesia: Epidural   Fluids:2266ml EBL: UOP:   Findings: Viable female infant in vertex position, boggy lower uterine segment. Grossly normal tubes and ovaries. Weight pending; Apgars 8,9   Specimen: Placenta to L/D   Procedure Note Consent verified pre-op. All questions answered   Patient was taken to the operating room where epidural anesthesia was dosed up, tested and found to be adequate. She was prepped and draped in the normal sterile fashion and placed in the dorsal supine position with a leftward tilt. An appropriate time out was performed. A Pfannenstiel skin incision was then made  scalpel and carried through to the underlying layer of fascia. The fascia was nicked in the midline and the incision was extended laterally with Mayo scissors. The superior and inferior aspects of the incision were grasped kocher clamps and dissected off the underlying rectus muscles.Rectus muscles were separated in the midline  and the peritoneal cavity entered bluntly. The peritoneal incision was then extended both superiorly and inferiorly with careful attention to avoid both bowel and bladder. The Alexis self-retaining wound retractor was then placed and the lower uterine segment exposed. Bowels were tucked away with wet lap sponges. The bladder flap was developed with Metzenbaum scissors and pushed away from the lower uterine segment. The lower uterine segment was then incised in a transverse fashion and the cavity itself entered bluntly.  The infant's head was then lifted, returned into the cavity from the lower uterine segment and delivered from the incision without difficulty. Vigorous spontaneous cry was noted during delivery along with terminal meconium. The  remainder of the infant delivered and the nose and mouth bulb suctioned. The cord was clamped and cut after a minute delay. The infant was handed off to the waiting NICU staff. The placenta was then spontaneously expressed from the uterus and the uterus cleared of all clots and debris with moist lap sponge. The uterine incision was then repaired in 2 layers the first layer was a running locked layer 1-0 chromic and the second an imbricating layer of the same suture. The tubes and ovaries were inspected and the gutters cleared of all clots and debris. The uterine incision was inspected and found to be hemostatic. Uterus was still noted to be boggy though so a dose of methergine was administered. All instruments and sponges were then removed from the abdomen. The peritoneum was then reapproximated in a purse string fashion with sutures of 2-0 Vicryl.  The fascia was then closed with 0 Vicryl in a running fashion.  The skin was closed with a subcuticular stitch of 4-0 Vicryl on a Keith needle and then reinforced with benzoin and Steri-Strips. of cytotec was then placed rectally.  At the conclusion of the procedure all instruments and sponge counts were correct. Patient was taken to the recovery room in good condition with her baby accompanying her skin to skin.

## 2020-10-18 NOTE — Lactation Note (Signed)
This note was copied from a baby's chart. Lactation Consultation Note  Patient Name: Hannah Walton [id number] Date: 10/18/2020   Age:40 hours  Mom reports basby Hannah Rylan hasn't latched since delivery and she is having trouble getting him latched. RN assessing baby and mom using manual pump to prepump nipple.  Infant cuing. LC assist with trying to latch him on moms right breast in football.  Attempt to tcup nipple and he still cant latch.  Mom post csection.  LC asked mom if we could try cross cradle position with him. Mom agreed. LC assist putting infant across mom on her right breast.  Infant fit well there tummy to tummy with mom.  Mom put her hands below ears and on shoulders and after a few attempts he latched on his own.  Cheeks/chin touching/mouth open wide.   Rythmic sucking and a few audible swallows.  He breastfed about 20 minutes.  Let go.  Showed mom how to do some hand expression.  LC hand expressed a few drops and gave him from a spoon.  He was sleepy.   Put him STS with mom.  He started cuing.  Mom wanted to order her breakfast.  Urged her to offer the other breast if he was still cuing as soon as she was done ordering breakfast and to ask RN or LC for assistance as needed. LC gave mom [facility] breastfeeding resource list for home use.  Urged to feed on cue and at least 8-12 times day.  Call lactation as needed.  Emilene Roma S Tylena Prisk 10/18/2020, 10:46 AM

## 2020-10-19 MED ORDER — ENOXAPARIN SODIUM 30 MG/0.3ML ~~LOC~~ SOLN
30.0000 mg | SUBCUTANEOUS | Status: DC
  Administered 2020-10-19: 30 mg via SUBCUTANEOUS
  Filled 2020-10-19 (×2): qty 0.3

## 2020-10-19 NOTE — Progress Notes (Signed)
Subjective: Postpartum Day 1: Cesarean Delivery Patient reports tolerating PO and no problems voiding.  Ambulating some, pain contolled  Objective: Vital signs in last 24 hours: Temp:  [97.8 F (36.6 C)-98 F (36.7 C)] 97.8 F (36.6 C) (12/31 0448) Pulse Rate:  [62-74] 68 (12/31 0448) Resp:  [16-18] 16 (12/31 0448) BP: (97-105)/(53-64) 105/60 (12/31 0448) SpO2:  [98 %-99 %] 99 % (12/31 0448)  Physical Exam:  General: alert and cooperative Lochia: appropriate Uterine Fundus: firm Incision: C/D/I   Recent Labs    10/16/20 1448 10/18/20 0514  HGB 11.1* 10.2*  HCT 35.3* 29.5*    Assessment/Plan: Status post Cesarean section. Doing well postoperatively.  Continue current care. Plan d/c tomorrow  Oliver Pila 10/19/2020, 11:26 AM

## 2020-10-19 NOTE — Lactation Note (Signed)
This note was copied from a baby's chart. Lactation Consultation Note  Patient Name: Hannah Walton [id number] Date: 10/19/2020 Reason for consult: Follow-up assessment;Difficult latch;Mother's request;Term;1st time breastfeeding;Infant weight loss Age:40 hours P1, term female infant with -2% weight loss. LC changed a void and stool diaper while in room. Parents will start documenting all voids, stools and breastfeeding frequency/ formula supplement amounts.  Per mom, she has been formula feeding infant all day but would like to BF her infant. Per mom, she tried breastfeeding infant  4x yesterday but infant was fussy and not sustaining latch. LC notice mom has small nipples that are flat. Tools given: Breast shells, 24 mm NS, hand pump and DEBP. LC ask mom to pre-pump breast with the hand pump she was given, LC fitted mom with 16 mm NS, and LC asked dad to  put  a few drops of formula in NS with LC guidance , infant latched on mom's left breast using the football hold position.  Infant sustained latch and BF for 20 minutes. Mom was pleased that infant latched and breastfed, LC notice after the latch LC  saw a smear of colostrum in mom's left breast. Mom will start using the DEBP every 3 hours for 15 minutes to help establish her milk supply. Mom was using the DEBP when [facility] was leaving the room. Mom knows to ask RN or LC if she needs further assistance with latching infant at the breast.  LC discussed with parents to attend [facility] BF support group ( free) after hospital discharge with the local community.  Mom's current plan:  1. LC gave mom breast shells to wear in the bra during the day, mom understands not to wear breast shell at night or while sleeping, mom is wearing breast shells due to having flat nipples. 2. Mom will pre-pump breast prior to applying 16 mm NS and will latch infant according to cues, 8 to 12+ times within 24 hours, STS every feeding before offering  formula. 3. After latching infant at the breast dad will supplement infant with formula according to infant's age/ hours of life,  until mom's milk supply is establish or present. LC gave parents BF supplemental sheet.  Maternal Data Has patient been taught Hand Expression?: Yes Does the patient have breastfeeding experience prior to this delivery?: No  Feeding Feeding Type: Breast Fed Nipple Type: Slow - flow  LATCH Score Latch: Grasps breast easily, tongue down, lips flanged, rhythmical sucking.  Audible Swallowing: Spontaneous and intermittent  Type of Nipple: Flat  Comfort (Breast/Nipple): Soft / non-tender  Hold (Positioning): Assistance needed to correctly position infant at breast and maintain latch.  LATCH Score: 8  Interventions Interventions: Breast feeding basics reviewed;Assisted with latch;Breast compression;Skin to skin;Adjust position;Breast massage;Support pillows;Hand express;Position options;Pre-pump if needed;Expressed milk;DEBP;Shells;Hand pump  Lactation Tools Discussed/Used WIC Program: No Pump Education: Setup, frequency, and cleaning;Milk Storage Initiated by:: Danelle Earthly, IBCL Date initiated:: 10/19/20   Consult Status Consult Status: Follow-up Date: 10/20/20 Follow-up type: In-patient    Danelle Earthly 10/19/2020, 6:09 PM

## 2020-10-20 LAB — COMPREHENSIVE METABOLIC PANEL
ALT: 18 U/L (ref 0–44)
AST: 34 U/L (ref 15–41)
Albumin: 1.7 g/dL — ABNORMAL LOW (ref 3.5–5.0)
Alkaline Phosphatase: 104 U/L (ref 38–126)
Anion gap: 10 (ref 5–15)
BUN: 12 mg/dL (ref 6–20)
CO2: 21 mmol/L — ABNORMAL LOW (ref 22–32)
Calcium: 8.5 mg/dL — ABNORMAL LOW (ref 8.9–10.3)
Chloride: 108 mmol/L (ref 98–111)
Creatinine, Ser: 0.98 mg/dL (ref 0.44–1.00)
GFR, Estimated: >60 mL/min (ref >60)
Glucose, Bld: 77 mg/dL (ref 70–99)
Potassium: 3.7 mmol/L (ref 3.5–5.1)
Sodium: 139 mmol/L (ref 135–145)
Total Bilirubin: 0.2 mg/dL — ABNORMAL LOW (ref 0.3–1.2)
Total Protein: 4.6 g/dL — ABNORMAL LOW (ref 6.5–8.1)

## 2020-10-20 LAB — CBC
HCT: 24.7 % — ABNORMAL LOW (ref 36.0–46.0)
Hemoglobin: 8.3 g/dL — ABNORMAL LOW (ref 12.0–15.0)
MCH: 28.3 pg (ref 26.0–34.0)
MCHC: 33.6 g/dL (ref 30.0–36.0)
MCV: 84.3 fL (ref 80.0–100.0)
Platelets: 150 10*3/uL (ref 150–400)
RBC: 2.93 MIL/uL — ABNORMAL LOW (ref 3.87–5.11)
RDW: 14 % (ref 11.5–15.5)
WBC: 12.4 10*3/uL — ABNORMAL HIGH (ref 4.0–10.5)
nRBC: 0 % (ref 0.0–0.2)

## 2020-10-20 MED ORDER — ACETAMINOPHEN 500 MG PO TABS: 1000.0000 mg | ORAL_TABLET | Freq: Four times a day (QID) | ORAL | 0 refills | Status: DC

## 2020-10-20 MED ORDER — IBUPROFEN 800 MG PO TABS: 800.0000 mg | ORAL_TABLET | Freq: Three times a day (TID) | ORAL | 0 refills | Status: DC

## 2020-10-20 NOTE — Progress Notes (Signed)
Subjective: Postpartum Day 2: Cesarean Delivery Patient reports tolerating PO, + flatus and no problems voiding.    Objective: Vital signs in last 24 hours: Temp:  [97.8 F (36.6 C)-98.5 F (36.9 C)] 97.9 F (36.6 C) (01/01 0553) Pulse Rate:  [61-70] 61 (01/01 0553) Resp:  [16-18] 18 (01/01 0553) BP: (102-141)/(73-91) 113/73 (01/01 0553)  Physical Exam:  General: alert and cooperative Lochia: appropriate Uterine Fundus: firm Incision: C/D/I   Recent Labs    10/18/20 0514 10/20/20 0442  HGB 10.2* 8.3*  HCT 29.5* 24.7*    Assessment/Plan: Status post Cesarean section. Doing well postoperatively.  Discharge home with standard precautions and return to office in 2 weeks for incision check.  Oliver Pila 10/20/2020, 6:46 AM

## 2020-10-20 NOTE — Discharge Summary (Signed)
Postpartum Discharge Summary       Patient Name: Hannah Walton DOB: [birth year] MRN: [id number]  Date of admission: 10/16/2020 Delivery date:10/18/2020  Delivering provider: [facility]  Date of discharge: 10/20/2020  Admitting diagnosis: Indication for care in labor and delivery, antepartum [O75.9] Intrauterine pregnancy: [redacted]w[redacted]d     Secondary diagnosis:  Active Problems:   Indication for care in labor and delivery, antepartum   S/P cesarean section  Additional problems: Arrest of dilation, AMA    Discharge diagnosis: Term Pregnancy Delivered                                              Post partum procedures:none Augmentation: AROM, Pitocin and Cytotec Complications: None  Hospital course: Induction of Labor With Unplanned c-section 40 y.o. yo G1P1001 at [redacted]w[redacted]d was admitted to the hospital 10/16/2020 for induction of labor.  Indication for induction: Postdates and AMA.  Patient had a labor course as follows: Membrane Rupture Time/Date: 7:27 PM ,10/16/2020   Delivery Method:C-Section, Low Transverse  Episiotomy: None  Lacerations:  None  Details of delivery can be found in separate delivery note.  Patient had a routine postpartum course. Patient is discharged home 10/20/20.  Newborn Data: Birth date:10/18/2020  Birth time:12:55 AM  Gender:Female  Living status:Living  Apgars:8 ,9  [id number] g     Physical exam  Vitals:   10/19/20 0448 10/19/20 1641 10/19/20 2339 10/20/20 0553  BP: 105/60 (!) 102/91 (!) 141/85 113/73  Pulse: 68 66 70 61  Resp: 16 16 16 18   Temp: 97.8 F (36.6 C) 98.5 F (36.9 C) 97.8 F (36.6 C) 97.9 F (36.6 C)  TempSrc: Oral Oral Oral Oral  SpO2: 99%     Weight:      Height:       General: alert and cooperative Lochia: appropriate Uterine Fundus: firm Incision: Dressing is clean, dry, and intact DVT Evaluation: No evidence of DVT seen on physical exam. Labs: Lab Results  Component Value Date   WBC 12.4 (H)  10/20/2020   HGB 8.3 (L) 10/20/2020   HCT 24.7 (L) 10/20/2020   MCV 84.3 10/20/2020   PLT 150 10/20/2020   CMP Latest Ref Rng & Units 10/20/2020  Glucose 70 - 99 mg/dL 77  BUN 6 - 20 mg/dL 12  Creatinine 12/18/2020 - 4.09 mg/dL 8.11  Sodium 9.14 - 782 mmol/L 139  Potassium 3.5 - 5.1 mmol/L 3.7  Chloride 98 - 111 mmol/L 108  CO2 22 - 32 mmol/L 21(L)  Calcium 8.9 - 10.3 mg/dL 956)  Total Protein 6.5 - 8.1 g/dL 4.6(L)  Total Bilirubin 0.3 - 1.2 mg/dL 2.1(H)  Alkaline Phos 38 - 126 U/L 104  AST 15 - 41 U/L 34  ALT 0 - 44 U/L 18   Edinburgh Score: Edinburgh Postnatal Depression Scale Screening Tool 10/18/2020  I have been able to laugh and see the funny side of things. 0  I have looked forward with enjoyment to things. 0  I have blamed myself unnecessarily when things went wrong. 1  I have been anxious or worried for no good reason. 0  I have felt scared or panicky for no good reason. 0  Things have been getting on top of me. 1  I have been so unhappy that I have had difficulty sleeping. 0  I have felt sad or miserable. 0  I have been so unhappy that I have been crying. 0  The thought of harming myself has occurred to me. 0  Edinburgh Postnatal Depression Scale Total 2     After visit meds:  Allergies as of 10/20/2020      Reactions   Yasmin [drospirenone-ethinyl Estradiol] Nausea Only      Medication List    STOP taking these medications   azithromycin 250 MG tablet Commonly known as: Zithromax Z-Pak   cyclobenzaprine 10 MG tablet Commonly known as: FLEXERIL   pantoprazole 40 MG tablet Commonly known as: PROTONIX     TAKE these medications   acetaminophen 500 MG tablet Commonly known as: TYLENOL Take 2 tablets (1,000 mg total) by mouth every 6 (six) hours. What changed:   how much to take  when to take this  reasons to take this   albuterol 108 (90 Base) MCG/ACT inhaler Commonly known as: VENTOLIN HFA Inhale 2 puffs into the lungs every 6 (six) hours as  needed.   ibuprofen 800 MG tablet Commonly known as: ADVIL Take 1 tablet (800 mg total) by mouth every 8 (eight) hours.   prenatal multivitamin Tabs tablet Take 1 tablet by mouth daily at 12 noon.        Discharge home in stable condition Infant Feeding: Bottle and Breast Infant Disposition:home with mother Discharge instruction: per After Visit Summary and Postpartum booklet. Activity: Advance as tolerated. Pelvic rest for 6 weeks.  Diet: routine diet Future Appointments:No future appointments. Follow up Visit:  Follow-up Information    Edwinna Areola, DO. Schedule an appointment as soon as possible for a visit in 2 week(s).   Specialty: Obstetrics and Gynecology Why: Incision check with CB or KR Contact information: [address] [place] [address] [phone]                Please schedule this patient for a In person postpartum visit in 2 weeks with the following provider: MD.  Delivery mode:  C-Section, Low Transverse  Anticipated Birth Control:  Unsure   10/20/2020 Oliver Pila, MD

## 2020-10-31 DIAGNOSIS — O139 Gestational [pregnancy-induced] hypertension without significant proteinuria, unspecified trimester: Secondary | ICD-10-CM | POA: Diagnosis not present

## 2020-11-03 NOTE — Addendum Note (Signed)
Addendum  created 11/03/20 2228 by Leonides Grills, MD   [facility]procedure Staff edited

## 2020-12-12 DIAGNOSIS — Z1389 Encounter for screening for other disorder: Secondary | ICD-10-CM | POA: Diagnosis not present

## 2020-12-12 DIAGNOSIS — Z3009 Encounter for other general counseling and advice on contraception: Secondary | ICD-10-CM | POA: Diagnosis not present

## 2021-01-17 ENCOUNTER — Other Ambulatory Visit: Payer: Self-pay

## 2021-01-17 ENCOUNTER — Encounter: Payer: Self-pay | Admitting: Family Medicine

## 2021-01-17 ENCOUNTER — Ambulatory Visit (INDEPENDENT_AMBULATORY_CARE_PROVIDER_SITE_OTHER): Payer: BC Managed Care – PPO | Admitting: Family Medicine

## 2021-01-17 DIAGNOSIS — R202 Paresthesia of skin: Secondary | ICD-10-CM | POA: Diagnosis not present

## 2021-01-17 NOTE — Patient Instructions (Addendum)
Get a wrist splint (for carpal tunnel) at a pharmacy  Wear it to sleep and some during the day if you can   Try moving the baby to the other side Be mindful of repetitive movement or things that make it worse  If any pain let me know   Update me if not improved in 1-2 weeks

## 2021-01-17 NOTE — Assessment & Plan Note (Signed)
Only the index finger of L hand  No motor deficits Neg tinel/phalen tests Disc holding newborn baby and reped tasks with that hand Enc her to change position and sides Recommend carpal tunnel wrist splint to wear at night and prn Consider testing /ncv if no improvement  inst to call if worse or if pain develops

## 2021-01-17 NOTE — Progress Notes (Signed)
Subjective:    Patient ID: Hannah Walton, female    DOB: 1980/12/13, 40 y.o.   MRN: [id number]  This visit occurred during the SARS-CoV-2 public health emergency.  Safety protocols were in place, including screening questions prior to the visit, additional usage of staff PPE, and extensive cleaning of exam room while observing appropriate contact time as indicated for disinfecting solutions.    HPI Pt presents for numbness in hand   Wt Readings from Last 3 Encounters:  01/17/21 208 lb 3 oz (94.4 kg)  10/16/20 232 lb (105.2 kg)  05/31/20 214 lb (97.1 kg)   36.88 kg/m  Left hand, index finger feels funny  Tingling and sort of numb  Constant  3 weeks   Last night her whole hand felt funny also - now back to normal   Sleeps on the L arm /wrist often   No weakness or trouble gripping  No reped motion (chnages diapers)  Just had a baby 3 months ago    Neck and elbow feel fine   No vision trouble or headaches  BP Readings from Last 3 Encounters:  01/17/21 124/72  10/20/20 113/73  05/31/20 118/66   Pulse Readings from Last 3 Encounters:  01/17/21 69  10/20/20 61  05/31/20 90   Patient Active Problem List   Diagnosis Date Noted  . Paresthesia of hand 01/17/2021  . S/P cesarean section 10/18/2020  . Indication for care in labor and delivery, antepartum 10/16/2020  . Generalized abdominal pain 03/16/2020  . Elevated BP without diagnosis of hypertension 05/12/2019  . Headache 05/12/2019  . Paresthesia of left arm 05/11/2017  . Amenorrhea 11/26/2015  . Encounter for routine gynecological examination 05/16/2015  . Routine general medical examination at a health care facility 05/08/2015  . Other constipation 03/07/2014   Past Medical History:  Diagnosis Date  . Cyst of knee joint 10/2014   left  . Cyst of left knee joint 10/27/2014  . History of gastric ulcer   . Sinus headache   . Synovitis of knee 10/2014   left   Past Surgical History:  Procedure  Laterality Date  . CESAREAN SECTION N/A 10/17/2020   Procedure: CESAREAN SECTION;  Surgeon: Edwinna Areola, DO;  Location: [facility];  Service: Obstetrics;  Laterality: N/A;  . KNEE ARTHROSCOPY Left 10/27/2014   Procedure: LEFT KNEE SCOPE SYNOVECTOMY;  Surgeon: Eulas Post, MD;  Location: [facility];  Service: Orthopedics;  Laterality: Left;  Marland Kitchen MASS EXCISION Left 10/27/2014   Procedure: LEFT PCL CYST EXCISION ;  Surgeon: Eulas Post, MD;  Location: [facility];  Service: Orthopedics;  Laterality: Left;  . UPPER GI ENDOSCOPY     Social History   Tobacco Use  . Smoking status: Never Smoker  . Smokeless tobacco: Never Used  Vaping Use  . Vaping Use: Never used  Substance Use Topics  . Alcohol use: No    Alcohol/week: 0.0 standard drinks  . Drug use: No   Family History  Problem Relation Age of Onset  . Arthritis Mother   . Diabetes Maternal Grandmother   . Arthritis Paternal Grandmother   . Stroke Father   . Stroke Paternal Uncle   . Diabetes Paternal Uncle    Allergies  Allergen Reactions  . Yasmin [Drospirenone-Ethinyl Estradiol] Nausea Only   Current Outpatient Medications on File Prior to Visit  Medication Sig Dispense Refill  . acetaminophen (TYLENOL) 500 MG tablet Take 2 tablets (1,000 mg total) by mouth  every 6 (six) hours. 30 tablet 0  . albuterol (VENTOLIN HFA) 108 (90 Base) MCG/ACT inhaler Inhale 2 puffs into the lungs every 6 (six) hours as needed. 6.7 g 0  . ibuprofen (ADVIL) 800 MG tablet Take 1 tablet (800 mg total) by mouth every 8 (eight) hours. 30 tablet 0  . Prenatal Vit-Fe Fumarate-FA (PRENATAL MULTIVITAMIN) TABS tablet Take 1 tablet by mouth daily at 12 noon.     No current facility-administered medications on file prior to visit.      Review of Systems  Constitutional: Negative for activity change, appetite change, fatigue, fever and unexpected weight change.  HENT: Negative for congestion, ear pain, rhinorrhea,  sinus pressure and sore throat.   Eyes: Negative for pain, redness and visual disturbance.  Respiratory: Negative for cough, shortness of breath and wheezing.   Cardiovascular: Negative for chest pain and palpitations.  Gastrointestinal: Negative for abdominal pain, blood in stool, constipation and diarrhea.  Endocrine: Negative for polydipsia and polyuria.  Genitourinary: Negative for dysuria, frequency and urgency.  Musculoskeletal: Negative for arthralgias, back pain, myalgias, neck pain and neck stiffness.  Skin: Negative for pallor and rash.  Allergic/Immunologic: Negative for environmental allergies.  Neurological: Positive for numbness. Negative for dizziness, syncope and headaches.  Hematological: Negative for adenopathy. Does not bruise/bleed easily.  Psychiatric/Behavioral: Negative for decreased concentration and dysphoric mood. The patient is not nervous/anxious.        Objective:   Physical Exam Constitutional:      Appearance: Normal appearance. She is obese.  Eyes:     Conjunctiva/sclera: Conjunctivae normal.     Pupils: Pupils are equal, round, and reactive to light.  Cardiovascular:     Rate and Rhythm: Normal rate and regular rhythm.     Pulses: Normal pulses.     Heart sounds: Normal heart sounds.  Pulmonary:     Effort: Pulmonary effort is normal.     Breath sounds: Normal breath sounds.  Musculoskeletal:        General: No tenderness.     Cervical back: Normal range of motion and neck supple. No tenderness.     Right lower leg: No edema.     Left lower leg: No edema.     Comments: No neck, shoulder or elbow tenderness No acute joint changes   Lymphadenopathy:     Cervical: No cervical adenopathy.  Skin:    General: Skin is warm and dry.     Coloration: Skin is not pale.     Findings: No erythema or rash.  Neurological:     Mental Status: She is alert.     Cranial Nerves: No cranial nerve deficit.     Sensory: Sensory deficit present.     Motor:  Motor function is intact. No weakness, tremor, atrophy or abnormal muscle tone.     Coordination: Coordination is intact. Coordination normal.     Gait: Gait normal.     Deep Tendon Reflexes: Reflexes normal.     Reflex Scores:      Bicep reflexes are 2+ on the right side.    Comments: Decreased sensation in L index finger to soft touch and sharp stimulation  Nl temp sensation  No motor changes  Neg tinel and phalen tests            Assessment & Plan:   Problem List Items Addressed This Visit      Other   Paresthesia of hand    Only the index finger of L hand  No  motor deficits Neg tinel/phalen tests Disc holding newborn baby and reped tasks with that hand Enc her to change position and sides Recommend carpal tunnel wrist splint to wear at night and prn Consider testing /ncv if no improvement  inst to call if worse or if pain develops

## 2021-01-21 ENCOUNTER — Telehealth: Payer: Self-pay | Admitting: Family Medicine

## 2021-01-21 NOTE — Telephone Encounter (Signed)
Pt notified of what she needs (wrist splint), pt will get that. Since pt's call to [place] she has contacted her gyn and they have her coming in tomorrow to f/u and address her bleeding issues.   FYI to PCP

## 2021-01-21 NOTE — Telephone Encounter (Signed)
Instruct her to get a wrist splint (the type for carpal tunnel) at a pharmacy or med supply store (occ sporting goods store would have) We have some here if she prefers (more $ however)   Has she reached out to her obgyn?  Is she in need of contraception? (what did she use in the past)? Is she breast feeding?

## 2021-01-21 NOTE — Telephone Encounter (Signed)
Left VM with family requesting pt to call us back

## 2021-01-21 NOTE — Telephone Encounter (Signed)
Hannah Walton ci and wanted to know about getting a wrist ring for her finger and she doesn't know what to get.  And wanted to know she started her cycle and she has been on it for the past 3 weeks and she is heavy.   Please advise

## 2021-01-22 DIAGNOSIS — N920 Excessive and frequent menstruation with regular cycle: Secondary | ICD-10-CM | POA: Diagnosis not present

## 2021-01-24 DIAGNOSIS — Z1159 Encounter for screening for other viral diseases: Secondary | ICD-10-CM | POA: Diagnosis not present

## 2021-01-24 DIAGNOSIS — Z20828 Contact with and (suspected) exposure to other viral communicable diseases: Secondary | ICD-10-CM | POA: Diagnosis not present

## 2021-02-07 DIAGNOSIS — Z20828 Contact with and (suspected) exposure to other viral communicable diseases: Secondary | ICD-10-CM | POA: Diagnosis not present

## 2021-02-07 DIAGNOSIS — Z1159 Encounter for screening for other viral diseases: Secondary | ICD-10-CM | POA: Diagnosis not present

## 2021-02-14 DIAGNOSIS — Z20828 Contact with and (suspected) exposure to other viral communicable diseases: Secondary | ICD-10-CM | POA: Diagnosis not present

## 2021-02-14 DIAGNOSIS — Z1159 Encounter for screening for other viral diseases: Secondary | ICD-10-CM | POA: Diagnosis not present

## 2021-02-21 DIAGNOSIS — Z1159 Encounter for screening for other viral diseases: Secondary | ICD-10-CM | POA: Diagnosis not present

## 2021-02-21 DIAGNOSIS — Z20828 Contact with and (suspected) exposure to other viral communicable diseases: Secondary | ICD-10-CM | POA: Diagnosis not present

## 2021-02-28 DIAGNOSIS — Z20828 Contact with and (suspected) exposure to other viral communicable diseases: Secondary | ICD-10-CM | POA: Diagnosis not present

## 2021-02-28 DIAGNOSIS — Z1159 Encounter for screening for other viral diseases: Secondary | ICD-10-CM | POA: Diagnosis not present

## 2021-03-07 DIAGNOSIS — Z1159 Encounter for screening for other viral diseases: Secondary | ICD-10-CM | POA: Diagnosis not present

## 2021-03-07 DIAGNOSIS — Z20828 Contact with and (suspected) exposure to other viral communicable diseases: Secondary | ICD-10-CM | POA: Diagnosis not present

## 2021-03-21 DIAGNOSIS — Z1159 Encounter for screening for other viral diseases: Secondary | ICD-10-CM | POA: Diagnosis not present

## 2021-03-21 DIAGNOSIS — Z20828 Contact with and (suspected) exposure to other viral communicable diseases: Secondary | ICD-10-CM | POA: Diagnosis not present

## 2021-03-28 DIAGNOSIS — Z1159 Encounter for screening for other viral diseases: Secondary | ICD-10-CM | POA: Diagnosis not present

## 2021-03-28 DIAGNOSIS — Z20828 Contact with and (suspected) exposure to other viral communicable diseases: Secondary | ICD-10-CM | POA: Diagnosis not present

## 2021-04-15 ENCOUNTER — Other Ambulatory Visit: Payer: Self-pay

## 2021-04-15 ENCOUNTER — Encounter: Payer: Self-pay | Admitting: Podiatry

## 2021-04-15 ENCOUNTER — Ambulatory Visit (INDEPENDENT_AMBULATORY_CARE_PROVIDER_SITE_OTHER): Payer: BC Managed Care – PPO | Admitting: Podiatry

## 2021-04-15 DIAGNOSIS — L600 Ingrowing nail: Secondary | ICD-10-CM

## 2021-04-15 MED ORDER — NEOMYCIN-POLYMYXIN-HC 3.5-10000-1 OT SUSP: OTIC | 0 refills | Status: DC

## 2021-04-15 NOTE — Patient Instructions (Signed)

## 2021-04-15 NOTE — Progress Notes (Signed)
  Subjective:  Patient ID: Hannah Walton, female    DOB: 10-Feb-1981,  MRN: [id number]  Chief Complaint  Patient presents with   Nail Problem     NP/ I HAVE INGROWN TOENAILS ON BOTH OF MY BIG TOES    40 y.o. female presents with the above complaint. History confirmed with patient.   Objective:  Physical Exam: warm, good capillary refill, no trophic changes or ulcerative lesions, normal DP and PT pulses, and normal sensory exam.  Bilaterally she has ingrown hallux nails medial and lateral borders without paronychia  Assessment:   1. Ingrowing right great toenail   2. Ingrowing left great toenail      Plan:  Patient was evaluated and treated and all questions answered.    Ingrown Nail, bilaterally -Patient elects to proceed with minor surgery to remove ingrown toenail today. Consent reviewed and signed by patient. -Ingrown nail excised. See procedure note. -Educated on post-procedure care including soaking. Written instructions provided and reviewed. -Patient to follow up in 2 weeks for nail check.0 -Cortisporin Rx to pharmacy  Procedure: Excision of Ingrown Toenail Location: Bilateral 1st toe  medial and lateral  nail borders. Anesthesia: Lidocaine 1% plain; 1.5 mL and Marcaine 0.5% plain; 1.5 mL, digital block. Skin Prep: Betadine. Dressing: Silvadene; telfa; dry, sterile, compression dressing. Technique: Following skin prep, the toe was exsanguinated and a tourniquet was secured at the base of the toe. The affected nail border was freed, split with a nail splitter, and excised. Chemical matrixectomy was then performed with phenol and irrigated out with alcohol. The tourniquet was then removed and sterile dressing applied. Disposition: Patient tolerated procedure well. Patient to return in 2 weeks for follow-up.    Return in about 2 weeks (around 04/29/2021) for nail re-check.

## 2021-04-18 DIAGNOSIS — Z20828 Contact with and (suspected) exposure to other viral communicable diseases: Secondary | ICD-10-CM | POA: Diagnosis not present

## 2021-04-18 DIAGNOSIS — Z1159 Encounter for screening for other viral diseases: Secondary | ICD-10-CM | POA: Diagnosis not present

## 2021-04-25 DIAGNOSIS — Z1159 Encounter for screening for other viral diseases: Secondary | ICD-10-CM | POA: Diagnosis not present

## 2021-04-25 DIAGNOSIS — Z20828 Contact with and (suspected) exposure to other viral communicable diseases: Secondary | ICD-10-CM | POA: Diagnosis not present

## 2021-04-29 ENCOUNTER — Ambulatory Visit: Payer: BC Managed Care – PPO | Admitting: Podiatry

## 2021-04-29 ENCOUNTER — Other Ambulatory Visit: Payer: Self-pay

## 2021-04-29 DIAGNOSIS — L600 Ingrowing nail: Secondary | ICD-10-CM

## 2021-04-29 NOTE — Progress Notes (Signed)
  Subjective:  Patient ID: Hannah Walton, female    DOB: [birth year],  MRN: [id number]  Chief Complaint  Patient presents with   Ingrown Toenail     Return in about 2 weeks (around 04/29/2021) for nail re-check.    40 y.o. female returns for follow-up with the above complaint. History confirmed with patient.   Objective:  Physical Exam: warm, good capillary refill, no trophic changes or ulcerative lesions, normal DP and PT pulses, and normal sensory exam.  Matricectomy sites are healing well  Assessment:   1. Ingrowing right great toenail   2. Ingrowing left great toenail      Plan:  Patient was evaluated and treated and all questions answered.    Ingrown Nail, bilaterally -Healing well without signs of infection no or residual nail plate.  Can leave open to air and discontinue soaks and ointment at this point.  Return as needed for further issues with these or other toes or other issues   No follow-ups on file.

## 2021-05-02 DIAGNOSIS — Z1159 Encounter for screening for other viral diseases: Secondary | ICD-10-CM | POA: Diagnosis not present

## 2021-05-02 DIAGNOSIS — Z20828 Contact with and (suspected) exposure to other viral communicable diseases: Secondary | ICD-10-CM | POA: Diagnosis not present

## 2021-05-09 DIAGNOSIS — Z20828 Contact with and (suspected) exposure to other viral communicable diseases: Secondary | ICD-10-CM | POA: Diagnosis not present

## 2021-05-09 DIAGNOSIS — Z1159 Encounter for screening for other viral diseases: Secondary | ICD-10-CM | POA: Diagnosis not present

## 2021-05-16 DIAGNOSIS — Z1159 Encounter for screening for other viral diseases: Secondary | ICD-10-CM | POA: Diagnosis not present

## 2021-05-16 DIAGNOSIS — Z20828 Contact with and (suspected) exposure to other viral communicable diseases: Secondary | ICD-10-CM | POA: Diagnosis not present

## 2021-05-22 ENCOUNTER — Telehealth: Payer: Self-pay | Admitting: Podiatry

## 2021-05-22 NOTE — Telephone Encounter (Signed)
Patient called stating she had surgery about a month ago. Pt would like to know if she can have a pedicure done yet. Pts number is [phone].

## 2021-07-18 DIAGNOSIS — Z01411 Encounter for gynecological examination (general) (routine) with abnormal findings: Secondary | ICD-10-CM | POA: Diagnosis not present

## 2021-07-18 DIAGNOSIS — Z6837 Body mass index (BMI) 37.0-37.9, adult: Secondary | ICD-10-CM | POA: Diagnosis not present

## 2021-07-18 DIAGNOSIS — Z124 Encounter for screening for malignant neoplasm of cervix: Secondary | ICD-10-CM | POA: Diagnosis not present

## 2021-07-18 DIAGNOSIS — Z130 Encounter for screening for diseases of the blood and blood-forming organs and certain disorders involving the immune mechanism: Secondary | ICD-10-CM | POA: Diagnosis not present

## 2021-07-18 DIAGNOSIS — Z1151 Encounter for screening for human papillomavirus (HPV): Secondary | ICD-10-CM | POA: Diagnosis not present

## 2021-07-23 DIAGNOSIS — Z1231 Encounter for screening mammogram for malignant neoplasm of breast: Secondary | ICD-10-CM | POA: Diagnosis not present

## 2022-02-03 ENCOUNTER — Ambulatory Visit (INDEPENDENT_AMBULATORY_CARE_PROVIDER_SITE_OTHER)
Admission: RE | Admit: 2022-02-03 | Discharge: 2022-02-03 | Disposition: A | Discharge status: Home / Self Care | Payer: BC Managed Care – PPO | Source: Ambulatory Visit | Attending: Family Medicine | Admitting: Family Medicine

## 2022-02-03 ENCOUNTER — Encounter: Payer: Self-pay | Admitting: Family Medicine

## 2022-02-03 ENCOUNTER — Ambulatory Visit: Payer: BC Managed Care – PPO | Admitting: Family Medicine

## 2022-02-03 VITALS — BP 116/64 | HR 60 | Temp 97.7°F | Ht 63.0 in | Wt 223.5 lb

## 2022-02-03 DIAGNOSIS — M79672 Pain in left foot: Secondary | ICD-10-CM | POA: Insufficient documentation

## 2022-02-03 DIAGNOSIS — R202 Paresthesia of skin: Secondary | ICD-10-CM | POA: Diagnosis not present

## 2022-02-03 NOTE — Assessment & Plan Note (Addendum)
With tenderness of proximal L 4th MTP  ?No swelling  ?Some paresthesias, it seems to be hyper sensitive  ?Disc use of supportive /comfortable shoes and avoid activities that hurt  ?Reassuring exam  ?Foot xray ordered  ?Addendum: nl xray  ?Ref done to podiatry for further eval and tx  ? ?

## 2022-02-03 NOTE — Progress Notes (Signed)
? ?Subjective:  ? ? Patient ID: Hannah Walton, female    DOB: 08/18/1981, 41 y.o.   MRN: [id number] ? ?HPI ?Pt presents for L leg numbness ? ?Wt Readings from Last 3 Encounters:  ?02/03/22 223 lb 8 oz (101.4 kg)  ?01/17/21 208 lb 3 oz (94.4 kg)  ?10/16/20 232 lb (105.2 kg)  ? ?39.59 kg/m? ? ?Some tight/numb feeling in L lower leg  ?From her foot to the anterior shin ?Also foot pain (dorsal)  ?Started last week but has had it on/off in the past  (in the past it lasts several days)  ? ? ?Other leg is fine  ? 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 ? ?No weakness at all ? ? ?One day bothered her with rest  ?Last night was better  ? ? ?Patient Active Problem List  ? Diagnosis Date Noted  ? Paresthesia of foot 02/03/2022  ? Left foot pain 02/03/2022  ? Paresthesia of hand 01/17/2021  ? S/P cesarean section 10/18/2020  ? Indication for care in labor and delivery, antepartum 10/16/2020  ? Generalized abdominal pain 03/16/2020  ? Elevated BP without diagnosis of hypertension 05/12/2019  ? Headache 05/12/2019  ? Paresthesia of left arm 05/11/2017  ? Amenorrhea 11/26/2015  ? Encounter for routine gynecological examination 05/16/2015  ? Routine general medical examination at a health care facility 05/08/2015  ? Other constipation 03/07/2014  ? ?Past Medical History:  ?Diagnosis Date  ? Cyst of knee joint 10/2014  ? left  ? Cyst of left knee joint 10/27/2014  ? History of gastric ulcer   ? Sinus headache   ? Synovitis of knee 10/2014  ? left  ? ?Past Surgical History:  ?Procedure Laterality Date  ? CESAREAN SECTION N/A 10/17/2020  ? Procedure: CESAREAN SECTION;  Surgeon: Edwinna AreolaBanga, Cecilia Worema, DO;  Location: [facility];  Service: Obstetrics;  Laterality: N/A;  ? KNEE ARTHROSCOPY Left 10/27/2014  ? Procedure: LEFT KNEE SCOPE SYNOVECTOMY;  Surgeon: Eulas PostJoshua P Landau, MD;  Location: [facility];  Service: Orthopedics;  Laterality: Left;  ? MASS EXCISION Left  10/27/2014  ? Procedure: LEFT PCL CYST EXCISION ;  Surgeon: Eulas PostJoshua P Landau, MD;  Location: [facility];  Service: Orthopedics;  Laterality: Left;  ? UPPER GI ENDOSCOPY    ? ?Social History  ? ?Tobacco Use  ? Smoking status: Never  ? Smokeless tobacco: Never  ?Vaping Use  ? Vaping Use: Never used  ?Substance Use Topics  ? Alcohol use: No  ?  Alcohol/week: 0.0 standard drinks  ? Drug use: No  ? ?Family History  ?Problem Relation Age of Onset  ? Arthritis Mother   ? Diabetes Maternal Grandmother   ? Arthritis Paternal Grandmother   ? Stroke Father   ? Stroke Paternal Uncle   ? Diabetes Paternal Uncle   ? ?Allergies  ?Allergen Reactions  ? Yasmin [Drospirenone-Ethinyl Estradiol] Nausea Only  ? ?Current Outpatient Medications on File Prior to Visit  ?Medication Sig Dispense Refill  ? JUNEL FE 1/20 1-20 MG-MCG tablet Take 1 tablet by mouth daily.    ? ?No current facility-administered medications on file prior to visit.  ?  ?Review of Systems  ?Constitutional:  Negative for activity change, appetite change, fatigue, fever and unexpected weight change.  ?HENT:  Negative for congestion, ear pain, rhinorrhea, sinus pressure and sore throat.   ?Eyes:  Negative for pain, redness and visual disturbance.  ?  Respiratory:  Negative for cough, shortness of breath and wheezing.   ?Cardiovascular:  Negative for chest pain and palpitations.  ?Gastrointestinal:  Negative for abdominal pain, blood in stool, constipation and diarrhea.  ?Endocrine: Negative for polydipsia and polyuria.  ?Genitourinary:  Negative for dysuria, frequency and urgency.  ?Musculoskeletal:  Negative for arthralgias, back pain and myalgias.  ?     L foot pain   ?Skin:  Negative for pallor and rash.  ?Allergic/Immunologic: Negative for environmental allergies.  ?Neurological:  Positive for numbness. Negative for dizziness, syncope and headaches.  ?     Tingling in L foot and lower leg   ?Hematological:  Negative for adenopathy. Does not bruise/bleed  easily.  ?Psychiatric/Behavioral:  Negative for decreased concentration and dysphoric mood. The patient is not nervous/anxious.   ? ?   ?Objective:  ? Physical Exam ?Constitutional:   ?   General: She is not in acute distress. ?   Appearance: Normal appearance. She is obese. She is not ill-appearing or diaphoretic.  ?HENT:  ?   Head: Normocephalic and atraumatic.  ?Eyes:  ?   Conjunctiva/sclera: Conjunctivae normal.  ?   Pupils: Pupils are equal, round, and reactive to light.  ?Neck:  ?   Vascular: No carotid bruit.  ?Cardiovascular:  ?   Rate and Rhythm: Normal rate and regular rhythm.  ?   Heart sounds: Normal heart sounds.  ?Pulmonary:  ?   Effort: Pulmonary effort is normal. No respiratory distress.  ?   Breath sounds: Normal breath sounds. No wheezing or rales.  ?Musculoskeletal:     ?   General: Tenderness present.  ?   Cervical back: Neck supple. No tenderness.  ?   Right lower leg: No edema.  ?   Left lower leg: No edema.  ?   Comments: Tender over proximal L 4th MT  ?No swelling or skin change  ? ?Nl rom of foot and ankle  ?Nl gait  ?No crepitus in ankle or foot  ?Lymphadenopathy:  ?   Cervical: No cervical adenopathy.  ?Skin: ?   Coloration: Skin is not pale.  ?   Findings: No erythema or rash.  ?Neurological:  ?   Mental Status: She is alert.  ?   Cranial Nerves: No cranial nerve deficit.  ?   Motor: No weakness, tremor, atrophy, abnormal muscle tone or pronator drift.  ?   Coordination: Coordination normal.  ?   Gait: Gait normal.  ?   Deep Tendon Reflexes: Reflexes normal.  ?   Comments: L foot and ankle are hypersensitive to soft touch and temp  ? ?  ?Psychiatric:     ?   Mood and Affect: Mood normal.  ? ? ? ? ? ?   ?Assessment & Plan:  ? ?Problem List Items Addressed This Visit   ? ?  ? Other  ? Left foot pain - Primary  ?  With tenderness of proximal L 4th MTP  ?No swelling  ?Some paresthesias, it seems to be hyper sensitive  ?Disc use of supportive /comfortable shoes and avoid activities that hurt   ?Reassuring exam  ?Foot xray ordered  ? ?  ?  ? Relevant Orders  ? DG Foot Complete Left (Completed)  ? Paresthesia of foot  ?  This seems linked to dorsal foot pain  ?Reassuring exam ?Seems hypersensitive to light tough and temp  ? ?Xray of foot pending (also pain)  ?Consider ref to podiatry and/or neurology if not improved  ? ?  ?  ?  Relevant Orders  ? DG Foot Complete Left (Completed)  ? ? ? ?

## 2022-02-03 NOTE — Assessment & Plan Note (Signed)
This seems linked to dorsal foot pain  ?Reassuring exam ?Seems hypersensitive to light tough and temp  ? ?Xray of foot pending (also pain)  ?Consider ref to podiatry and/or neurology if not improved  ?

## 2022-02-03 NOTE — Patient Instructions (Signed)
Use ice /heat on foot if it bothers you  ? ?Avoid activities that hurt but keep moving if you can  ?Watch for swelling of leg or foot also  ?Don't wear a shoe that hurts or is uncomfortable  ? ?Xray today  ?We will contact you with a result  ? ? ? ? ?

## 2022-02-10 NOTE — Addendum Note (Signed)
Addended by: Roxy Manns A on: 02/10/2022 08:37 PM ? ? Modules accepted: Orders ? ?

## 2022-03-05 ENCOUNTER — Ambulatory Visit: Payer: BC Managed Care – PPO | Admitting: Family Medicine

## 2022-03-05 ENCOUNTER — Encounter: Payer: Self-pay | Admitting: Family Medicine

## 2022-03-05 DIAGNOSIS — L989 Disorder of the skin and subcutaneous tissue, unspecified: Secondary | ICD-10-CM

## 2022-03-05 MED ORDER — TRIAMCINOLONE ACETONIDE 0.1 % EX CREA: 1.0000 "application " | TOPICAL_CREAM | Freq: Two times a day (BID) | CUTANEOUS | 0 refills | Status: DC

## 2022-03-05 NOTE — Progress Notes (Signed)
? ? Patient ID: Hannah Walton, female    DOB: September 05, 1981, 41 y.o.   MRN: [id number] ? ?This visit was conducted in person. ? ?BP 122/78   Pulse 71   Temp 97.7 ?F (36.5 ?C) (Temporal)   Ht 5\' 3"  (1.6 m)   Wt 223 lb 4 oz (101.3 kg)   LMP 03/03/2022   SpO2 100%   BMI 39.55 kg/m?   ? ?CC: check spots to R upper arm  ?Subjective:  ? ?HPI: ?Hannah Walton is a 41 y.o. female presenting on 03/05/2022 for Insect Bite (C/o bites on upper R arm.  Noticed about 1 wk ago.  Areas are itchy and blister. ) ? ? ?1 wk h/o rash to upper R arm, thinks may have started after bug bites.  Lesions are more itchy than tender. ?She has treated with peroxide with benefit.  ?She works at 03/07/2022. ?No personal history of eczema. ? ?No fevers/chills, new joint pains, HA, nausea, abd pain. ? ?No new meds, foods, vitamins or supplements.  ?No new lotions, detergents, soaps or shampoos.  ?No h/o similar rash.  ?   ? ?Relevant past medical, surgical, family and social history reviewed and updated as indicated. Interim medical history since our last visit reviewed. ?Allergies and medications reviewed and updated. ?Outpatient Medications Prior to Visit  ?Medication Sig Dispense Refill  ? JUNEL FE 1/20 1-20 MG-MCG tablet Take 1 tablet by mouth daily.    ? ?No facility-administered medications prior to visit.  ?  ? ?Per HPI unless specifically indicated in ROS section below ?Review of Systems ? ?Objective:  ?BP 122/78   Pulse 71   Temp 97.7 ?F (36.5 ?C) (Temporal)   Ht 5\' 3"  (1.6 m)   Wt 223 lb 4 oz (101.3 kg)   LMP 03/03/2022   SpO2 100%   BMI 39.55 kg/m?   ?Wt Readings from Last 3 Encounters:  ?03/05/22 223 lb 4 oz (101.3 kg)  ?02/03/22 223 lb 8 oz (101.4 kg)  ?01/17/21 208 lb 3 oz (94.4 kg)  ?  ?  ?Physical Exam ?Vitals and nursing note reviewed.  ?Constitutional:   ?   Appearance: Normal appearance. She is not ill-appearing.  ?Skin: ?   General: Skin is warm and dry.  ?   Findings: Lesion present. Rash is not  crusting.  ? ?    ?   Comments: 2 isolated lesions to right upper extremity.  First lesion 1 cm diameter is macular postinflammatory hyperpigmentation.  Second smaller lesion to right lateral forearm is less than 1 cm in diameter, slightly erythematous with surrounding tiny blisters and mild central ulceration.  No other rash on body.  ?Neurological:  ?   Mental Status: She is alert.  ? ?   ? ?Assessment & Plan:  ? ?Problem List Items Addressed This Visit   ? ? Skin lesion of right arm  ?  Anticipate a contact dermatitis type reaction.  Not consistent with shingles, bug bites, eczema, psoriasis. ?We will treat with triamcinolone topical cream, reviewed customary topical steroid precautions. ?Update if worsening or not improving with treatment.  Patient agrees with plan. ? ?  ?  ?  ? ?Meds ordered this encounter  ?Medications  ? triamcinolone cream (KENALOG) 0.1 %  ?  Sig: Apply 1 application. topically 2 (two) times daily. Apply to AA.  ?  Dispense:  45 g  ?  Refill:  0  ? ?No orders of the defined types were placed in this encounter. ? ? ? ?  Patient Instructions  ?I think this is a reaction dermatitis - treat with steroid cream sent to pharmacy.  ?Let us know if worsening or not improving as expected.  ?Let us know if you notice any triggers.  ? ?Follow up plan: ?Return if symptoms worsen or fail to improve. ? ?Eustaquio Boyden, MD   ?

## 2022-03-05 NOTE — Assessment & Plan Note (Signed)
Anticipate a contact dermatitis type reaction.  Not consistent with shingles, bug bites, eczema, psoriasis. ?We will treat with triamcinolone topical cream, reviewed customary topical steroid precautions. ?Update if worsening or not improving with treatment.  Patient agrees with plan. ?

## 2022-03-05 NOTE — Patient Instructions (Signed)
I think this is a reaction dermatitis - treat with steroid cream sent to pharmacy.  ?Let us know if worsening or not improving as expected.  ?Let us know if you notice any triggers.  ?

## 2022-06-20 ENCOUNTER — Encounter: Payer: Self-pay | Admitting: Family Medicine

## 2022-06-20 ENCOUNTER — Ambulatory Visit: Payer: BC Managed Care – PPO | Admitting: Family Medicine

## 2022-06-20 VITALS — BP 122/68 | HR 87 | Temp 98.0°F | Ht 63.0 in | Wt 231.5 lb

## 2022-06-20 DIAGNOSIS — M545 Low back pain, unspecified: Secondary | ICD-10-CM | POA: Diagnosis not present

## 2022-06-20 MED ORDER — MELOXICAM 15 MG PO TABS
15.0000 mg | ORAL_TABLET | Freq: Every day | ORAL | 0 refills | Status: DC | PRN
Start: 2022-06-20 — End: 2022-07-23

## 2022-06-20 NOTE — Progress Notes (Signed)
Subjective:    Patient ID: Hannah Walton, female    DOB: 10-17-1981, 41 y.o.   MRN: [id number]  HPI Pt presents for back pain   Wt Readings from Last 3 Encounters:  06/20/22 231 lb 8 oz (105 kg)  03/05/22 223 lb 4 oz (101.3 kg)  02/03/22 223 lb 8 oz (101.4 kg)   41.01 kg/m   A month of lower back pain  Low back pain - both side  Does not radiate to leg   Sharp pain   Some tingling in that area   No loss of bowel or bladder control   No new heavy lifting (she does have to lift her son)  No new exercise    Worse to stand and walk  No pain when sitting still  Ok in bed     Last lumbar xray was 2015 DG Lumbar Spine Complete (Accession [id number]) (Order [id number]) Imaging Date: 03/07/2014 Department: [facility] [address] Released By: Alvina Chou Authorizing: Menachem Urbanek, Audrie Gallus, MD   Exam Status  Status  Final [99]   PACS Intelerad Image Link   Show images for DG Lumbar Spine Complete  Study Result  Narrative  CLINICAL DATA:  Intermittent low back and LEFT lower leg pain  question radiculopathy, lumbago   EXAM:  LUMBAR SPINE - COMPLETE 4+ VIEW   COMPARISON:  None   FINDINGS:  Osseous mineralization normal.   Five non-rib bearing lumbar vertebrae.   Vertebral body and disc space heights maintained.   No acute fracture, subluxation or bone destruction.   No spondylolysis.   SI joints symmetric.   IMPRESSION:  Normal exam.    Patient Active Problem List   Diagnosis Date Noted   Skin lesion of right arm 03/05/2022   Paresthesia of foot 02/03/2022   Left foot pain 02/03/2022   Paresthesia of hand 01/17/2021   S/P cesarean section 10/18/2020   Indication for care in labor and delivery, antepartum 10/16/2020   Generalized abdominal pain 03/16/2020   Elevated BP without diagnosis of hypertension 05/12/2019   Headache 05/12/2019   Paresthesia of left arm 05/11/2017   Amenorrhea 11/26/2015   Encounter for routine  gynecological examination 05/16/2015   Routine general medical examination at a health care facility 05/08/2015   Other constipation 03/07/2014   Low back pain 12/17/2006   Past Medical History:  Diagnosis Date   Cyst of knee joint 10/2014   left   Cyst of left knee joint 10/27/2014   History of gastric ulcer    Sinus headache    Synovitis of knee 10/2014   left   Past Surgical History:  Procedure Laterality Date   CESAREAN SECTION N/A 10/17/2020   Procedure: CESAREAN SECTION;  Surgeon: Edwinna Areola, DO;  Location: [facility];  Service: Obstetrics;  Laterality: N/A;   KNEE ARTHROSCOPY Left 10/27/2014   Procedure: LEFT KNEE SCOPE SYNOVECTOMY;  Surgeon: Eulas Post, MD;  Location: [facility];  Service: Orthopedics;  Laterality: Left;   MASS EXCISION Left 10/27/2014   Procedure: LEFT PCL CYST EXCISION ;  Surgeon: Eulas Post, MD;  Location: [facility];  Service: Orthopedics;  Laterality: Left;   UPPER GI ENDOSCOPY     Social History   Tobacco Use   Smoking status: Never   Smokeless tobacco: Never  Vaping Use   Vaping Use: Never used  Substance Use Topics   Alcohol use: No    Alcohol/week: 0.0 standard drinks of alcohol  Drug use: No   Family History  Problem Relation Age of Onset   Arthritis Mother    Diabetes Maternal Grandmother    Arthritis Paternal Grandmother    Stroke Father    Stroke Paternal Uncle    Diabetes Paternal Uncle    Allergies  Allergen Reactions   Yasmin [Drospirenone-Ethinyl Estradiol] Nausea Only   Current Outpatient Medications on File Prior to Visit  Medication Sig Dispense Refill   JUNEL FE 1/20 1-20 MG-MCG tablet Take 1 tablet by mouth daily.     No current facility-administered medications on file prior to visit.    Review of Systems  Constitutional:  Negative for activity change, appetite change, fatigue, fever and unexpected weight change.  HENT:  Negative for congestion, ear pain, rhinorrhea,  sinus pressure and sore throat.   Eyes:  Negative for pain, redness and visual disturbance.  Respiratory:  Negative for cough, shortness of breath and wheezing.   Cardiovascular:  Negative for chest pain and palpitations.  Gastrointestinal:  Negative for abdominal pain, blood in stool, constipation and diarrhea.  Endocrine: Negative for polydipsia and polyuria.  Genitourinary:  Negative for dysuria, frequency and urgency.  Musculoskeletal:  Positive for back pain. Negative for arthralgias and myalgias.  Skin:  Negative for pallor and rash.  Allergic/Immunologic: Negative for environmental allergies.  Neurological:  Negative for dizziness, syncope and headaches.  Hematological:  Negative for adenopathy. Does not bruise/bleed easily.  Psychiatric/Behavioral:  Negative for decreased concentration and dysphoric mood. The patient is not nervous/anxious.        Objective:   Physical Exam Constitutional:      General: She is not in acute distress.    Appearance: Normal appearance. She is well-developed. She is not ill-appearing or diaphoretic.  HENT:     Head: Normocephalic and atraumatic.  Eyes:     General: No scleral icterus.    Conjunctiva/sclera: Conjunctivae normal.     Pupils: Pupils are equal, round, and reactive to light.  Cardiovascular:     Rate and Rhythm: Normal rate and regular rhythm.  Pulmonary:     Effort: Pulmonary effort is normal.     Breath sounds: Normal breath sounds. No wheezing or rales.  Abdominal:     General: Bowel sounds are normal. There is no distension.     Palpations: Abdomen is soft.     Tenderness: There is no abdominal tenderness.  Musculoskeletal:        General: Tenderness present.     Cervical back: Normal range of motion and neck supple.     Lumbar back: Spasms and tenderness present. No swelling, edema, deformity, lacerations or bony tenderness. Decreased range of motion. Negative right straight leg raise test and negative left straight leg  raise test.     Comments: Some bilat tenderness (muscular) lumbar No bony tenderness  Pain to extend and flex L  Nl gait  Neg SLR  No neuro changes Nl rom of hips   Lymphadenopathy:     Cervical: No cervical adenopathy.  Skin:    General: Skin is warm and dry.     Coloration: Skin is not pale.     Findings: No erythema or rash.  Neurological:     Mental Status: She is alert.     Cranial Nerves: No cranial nerve deficit.     Sensory: No sensory deficit.     Motor: No weakness, atrophy or abnormal muscle tone.     Coordination: Coordination normal.     Deep Tendon Reflexes: Reflexes  are normal and symmetric. Reflexes normal.     Comments: Negative SLR  Psychiatric:        Mood and Affect: Mood normal.           Assessment & Plan:   Problem List Items Addressed This Visit       Other   Low back pain - Primary    Suspect lumbar strain- hurts most to stand and walk Reviewed last LS film  Reassuring exam No neuro changes  Recommend heat/ice /stretches (handout given) meloxicam px short term prn with food  Ref to PT- eval and tx  Consider imaging if not improving  ER precautions rev      Relevant Medications   meloxicam (MOBIC) 15 MG tablet   Other Relevant Orders   Ambulatory referral to Physical Therapy

## 2022-06-20 NOTE — Assessment & Plan Note (Signed)
Suspect lumbar strain- hurts most to stand and walk Reviewed last LS film  Reassuring exam No neuro changes  Recommend heat/ice /stretches (handout given) meloxicam px short term prn with food  Ref to PT- eval and tx  Consider imaging if not improving  ER precautions rev

## 2022-06-20 NOTE — Patient Instructions (Addendum)
Take meloxicam 15 mg with food up to once daily as needed   Take a look at the rehab handout - try the stretches and exercises  Gentle heat to painful area when needed 10 minutes   I will place a referral for physical therapy   If you don't get a call in 1-2 weeks call us

## 2022-07-23 ENCOUNTER — Other Ambulatory Visit: Payer: Self-pay | Admitting: Family Medicine

## 2022-07-23 NOTE — Telephone Encounter (Signed)
Pt had a back pain appt on 06/20/22 and Rx was given at that appt #30 tabs / 0 refills

## 2022-08-25 ENCOUNTER — Ambulatory Visit: Payer: BC Managed Care – PPO | Admitting: Family Medicine

## 2022-08-25 ENCOUNTER — Encounter: Payer: Self-pay | Admitting: Family Medicine

## 2022-08-25 VITALS — BP 110/70 | HR 92 | Temp 97.9°F | Ht 63.0 in | Wt 230.5 lb

## 2022-08-25 DIAGNOSIS — G4452 New daily persistent headache (NDPH): Secondary | ICD-10-CM

## 2022-08-25 DIAGNOSIS — K219 Gastro-esophageal reflux disease without esophagitis: Secondary | ICD-10-CM

## 2022-08-25 MED ORDER — PREDNISONE 10 MG PO TABS: ORAL_TABLET | ORAL | 0 refills | Status: DC

## 2022-08-25 NOTE — Assessment & Plan Note (Signed)
Worse lately-most nights, wakes her up  Disc avoiding trigger foods like tomato products  Pepcid 10-20 mg daily (she wants to use otc) Update if not improved

## 2022-08-25 NOTE — Assessment & Plan Note (Signed)
Acute on chronic Worse with recent travel and worse sleep Disc headache lifestyle habits Is well hydrated  Px pred taper 40 mg (worked in past) If not improved may heed to try gabapentin again  Nl neuro exam Update if not starting to improve in a week or if worsening

## 2022-08-25 NOTE — Progress Notes (Signed)
Subjective:    Patient ID: Hannah Walton, female    DOB: Nov 07, 1980, 41 y.o.   MRN: [id number]  HPI Pt presents with headache   Wt Readings from Last 3 Encounters:  08/25/22 230 lb 8 oz (104.6 kg)  06/20/22 231 lb 8 oz (105 kg)  03/05/22 223 lb 4 oz (101.3 kg)   40.83 kg/m  Has had a headache About a month - started getting them at work  [employer] on a cruise and had headache almost the whole time  Headache lasts most of the day  Sometimes wakes up with it  Sometimes happens at work  Lately it moves around = can be one or both sides Does throb   Triggers include  Hormone  Tomato products?  ? Stress   Usually goes to bed and gets up the same time  Lately wakes up listening for her child crying    No h/o sleep apnea  Does wake up with heartburn - a lot    Drinks water Avoids caffeine and other drinks and carbonation  Does not drink alcohol    Ears ring some  No ear pain  No ST No covid recently   No nausea No dizziness  No vision change      Sinus status - no congestion or runny nose  No sneezing     Took tylenol, flonase  Did try naproxen - briefly helped    H/o some migraine and tension ha in the past   BP Readings from Last 3 Encounters:  08/25/22 110/70  06/20/22 122/68  03/05/22 122/78   Pulse Readings from Last 3 Encounters:  08/25/22 92  06/20/22 87  03/05/22 71   Has used flexeril Predniosne- really helped  Gabapentin    Patient Active Problem List   Diagnosis Date Noted   GERD (gastroesophageal reflux disease) 08/25/2022   Skin lesion of right arm 03/05/2022   Paresthesia of foot 02/03/2022   Left foot pain 02/03/2022   Paresthesia of hand 01/17/2021   S/P cesarean section 10/18/2020   Indication for care in labor and delivery, antepartum 10/16/2020   Generalized abdominal pain 03/16/2020   Elevated BP without diagnosis of hypertension 05/12/2019   Headache 05/12/2019   Paresthesia of left arm 05/11/2017    Amenorrhea 11/26/2015   Encounter for routine gynecological examination 05/16/2015   Routine general medical examination at a health care facility 05/08/2015   Other constipation 03/07/2014   Low back pain 12/17/2006   Past Medical History:  Diagnosis Date   Cyst of knee joint 10/2014   left   Cyst of left knee joint 10/27/2014   History of gastric ulcer    Sinus headache    Synovitis of knee 10/2014   left   Past Surgical History:  Procedure Laterality Date   CESAREAN SECTION N/A 10/17/2020   Procedure: CESAREAN SECTION;  Surgeon: Sherlyn Hay, DO;  Location: [facility];  Service: Obstetrics;  Laterality: N/A;   KNEE ARTHROSCOPY Left 10/27/2014   Procedure: LEFT KNEE SCOPE SYNOVECTOMY;  Surgeon: Johnny Bridge, MD;  Location: [facility];  Service: Orthopedics;  Laterality: Left;   MASS EXCISION Left 10/27/2014   Procedure: LEFT PCL CYST EXCISION ;  Surgeon: Johnny Bridge, MD;  Location: [facility];  Service: Orthopedics;  Laterality: Left;   UPPER GI ENDOSCOPY     Social History   Tobacco Use   Smoking status: Never   Smokeless tobacco: Never  Vaping Use  Vaping Use: Never used  Substance Use Topics   Alcohol use: No    Alcohol/week: 0.0 standard drinks of alcohol   Drug use: No   Family History  Problem Relation Age of Onset   Arthritis Mother    Diabetes Maternal Grandmother    Arthritis Paternal Grandmother    Stroke Father    Stroke Paternal Uncle    Diabetes Paternal Uncle    Allergies  Allergen Reactions   Yasmin [Drospirenone-Ethinyl Estradiol] Nausea Only   Current Outpatient Medications on File Prior to Visit  Medication Sig Dispense Refill   JUNEL FE 1/20 1-20 MG-MCG tablet Take 1 tablet by mouth daily.     No current facility-administered medications on file prior to visit.    Review of Systems  Constitutional:  Negative for activity change, appetite change, fatigue, fever and unexpected weight change.  HENT:   Negative for congestion, ear pain, rhinorrhea, sinus pressure and sore throat.   Eyes:  Negative for pain, redness and visual disturbance.  Respiratory:  Negative for cough, shortness of breath and wheezing.   Cardiovascular:  Negative for chest pain and palpitations.  Gastrointestinal:  Negative for abdominal pain, blood in stool, constipation and diarrhea.       Heartburn   Endocrine: Negative for polydipsia and polyuria.  Genitourinary:  Negative for dysuria, frequency and urgency.  Musculoskeletal:  Negative for arthralgias, back pain and myalgias.  Skin:  Negative for pallor and rash.  Allergic/Immunologic: Negative for environmental allergies.  Neurological:  Positive for headaches. Negative for dizziness, tremors, seizures, syncope, facial asymmetry, speech difficulty, weakness, light-headedness and numbness.  Hematological:  Negative for adenopathy. Does not bruise/bleed easily.  Psychiatric/Behavioral:  Negative for decreased concentration and dysphoric mood. The patient is not nervous/anxious.        Objective:   Physical Exam Constitutional:      General: She is not in acute distress.    Appearance: She is well-developed.  HENT:     Head: Normocephalic and atraumatic.     Right Ear: External ear normal.     Left Ear: External ear normal.     Nose: Nose normal.     Mouth/Throat:     Pharynx: No oropharyngeal exudate.  Eyes:     General: No scleral icterus.       Right eye: No discharge.        Left eye: No discharge.     Conjunctiva/sclera: Conjunctivae normal.     Pupils: Pupils are equal, round, and reactive to light.     Comments: No nystagmus  Neck:     Thyroid: No thyromegaly.     Vascular: No carotid bruit or JVD.     Trachea: No tracheal deviation.  Cardiovascular:     Rate and Rhythm: Normal rate and regular rhythm.     Heart sounds: Normal heart sounds. No murmur heard. Pulmonary:     Effort: Pulmonary effort is normal. No respiratory distress.      Breath sounds: Normal breath sounds. No wheezing or rales.  Abdominal:     General: Bowel sounds are normal. There is no distension.     Palpations: Abdomen is soft. There is no mass.     Tenderness: There is no abdominal tenderness.  Musculoskeletal:        General: No tenderness.     Cervical back: Full passive range of motion without pain, normal range of motion and neck supple.  Lymphadenopathy:     Cervical: No cervical adenopathy.  Skin:  General: Skin is warm and dry.     Coloration: Skin is not pale.     Findings: No rash.  Neurological:     Mental Status: She is alert and oriented to person, place, and time.     Cranial Nerves: No cranial nerve deficit.     Sensory: No sensory deficit.     Motor: No tremor, atrophy or abnormal muscle tone.     Coordination: Coordination normal.     Gait: Gait normal.     Deep Tendon Reflexes: Reflexes are normal and symmetric.     Comments: No focal cerebellar signs   Psychiatric:        Behavior: Behavior normal.        Thought Content: Thought content normal.           Assessment & Plan:   Problem List Items Addressed This Visit       Digestive   GERD (gastroesophageal reflux disease)    Worse lately-most nights, wakes her up  Disc avoiding trigger foods like tomato products  Pepcid 10-20 mg daily (she wants to use otc) Update if not improved        Other   Headache - Primary    Acute on chronic Worse with recent travel and worse sleep Disc headache lifestyle habits Is well hydrated  Px pred taper 40 mg (worked in past) If not improved may heed to try gabapentin again  Nl neuro exam Update if not starting to improve in a week or if worsening

## 2022-08-25 NOTE — Patient Instructions (Signed)
Drink water  Avoid excessive caffeine   Take prednisone as directed  It will make you hyper and hungry  If headache does not improve let us know   For acid reflux take pepcid 10-20 mg once daily in the evening  Watch your diet for this  If not improved let us know

## 2022-11-07 ENCOUNTER — Ambulatory Visit (INDEPENDENT_AMBULATORY_CARE_PROVIDER_SITE_OTHER)
Admission: RE | Admit: 2022-11-07 | Discharge: 2022-11-07 | Disposition: A | Discharge status: Home / Self Care | Payer: BC Managed Care – PPO | Source: Ambulatory Visit | Attending: Family Medicine | Admitting: Family Medicine

## 2022-11-07 ENCOUNTER — Ambulatory Visit: Payer: BC Managed Care – PPO | Admitting: Family Medicine

## 2022-11-07 ENCOUNTER — Encounter: Payer: Self-pay | Admitting: Family Medicine

## 2022-11-07 VITALS — BP 116/70 | HR 71 | Temp 98.2°F | Ht 63.0 in | Wt 239.4 lb

## 2022-11-07 DIAGNOSIS — M545 Low back pain, unspecified: Secondary | ICD-10-CM

## 2022-11-07 DIAGNOSIS — M549 Dorsalgia, unspecified: Secondary | ICD-10-CM

## 2022-11-07 DIAGNOSIS — M546 Pain in thoracic spine: Secondary | ICD-10-CM | POA: Diagnosis not present

## 2022-11-07 MED ORDER — CYCLOBENZAPRINE HCL 10 MG PO TABS: 10.0000 mg | ORAL_TABLET | Freq: Three times a day (TID) | ORAL | 0 refills | Status: DC | PRN

## 2022-11-07 NOTE — Progress Notes (Signed)
Subjective:    Patient ID: Hannah Walton, female    DOB: [birth year], 42 y.o.   MRN: [id number]  HPI Pt presents for back problems (acute on chronic)  Wt Readings from Last 3 Encounters:  11/07/22 239 lb 6 oz (108.6 kg)  08/25/22 230 lb 8 oz (104.6 kg)  06/20/22 231 lb 8 oz (105 kg)   42.40 kg/m  Vitals:   11/07/22 1156  BP: 116/70  Pulse: 71  Temp: 98.2 F (36.8 C)  SpO2: 100%     H/o low back pain  Was tx with nsaid and ref to PT in sept 2023  Pain now is up a bit higher   Meloxicam did help / uses at night prn  Husband uses a hand held massager   Occ ibuprofen   Has not tried heat or ice   Wort pain is low back/both sides  Rad to L hip area /buttock  Not to legs or feet   Sharp pain  Worst to get up from sitting and then walking  Worst to bend forward   Lying on back helps a bit  Lying on side helps a bit   Picking up baby- now 30 or more lb   No weakness  No upper back tingles at times    Last LS imaging was 2015  DG Lumbar Spine Complete (Accession [id number]) (Order [id number]) Imaging Date: 03/07/2014 Department: [facility] Released By: Ellamae Sia Authorizing: Eureka Valdes, Wynelle Fanny, MD   Exam Status  Status  Final [99]   PACS Intelerad Image Link   Show images for DG Lumbar Spine Complete Study Result  Narrative  CLINICAL DATA:  Intermittent low back and LEFT lower leg pain question radiculopathy, lumbago  EXAM: LUMBAR SPINE - COMPLETE 4+ VIEW  COMPARISON:  None  FINDINGS: Osseous mineralization normal.  Five non-rib bearing lumbar vertebrae.  Vertebral body and disc space heights maintained.  No acute fracture, subluxation or bone destruction.  No spondylolysis.  SI joints symmetric.  IMPRESSION: Normal exam.   Electronically Signed   By: Lavonia Dana M.D.   On: 03/07/2014 16:36    Patient Active Problem List   Diagnosis Date Noted   Mid back pain 11/07/2022   GERD  (gastroesophageal reflux disease) 08/25/2022   Skin lesion of right arm 03/05/2022   Paresthesia of foot 02/03/2022   Left foot pain 02/03/2022   Paresthesia of hand 01/17/2021   S/P cesarean section 10/18/2020   Indication for care in labor and delivery, antepartum 10/16/2020   Generalized abdominal pain 03/16/2020   Elevated BP without diagnosis of hypertension 05/12/2019   Headache 05/12/2019   Paresthesia of left arm 05/11/2017   Amenorrhea 11/26/2015   Encounter for routine gynecological examination 05/16/2015   Routine general medical examination at a health care facility 05/08/2015   Other constipation 03/07/2014   Low back pain 12/17/2006   Past Medical History:  Diagnosis Date   Cyst of knee joint 10/2014   left   Cyst of left knee joint 10/27/2014   History of gastric ulcer    Sinus headache    Synovitis of knee 10/2014   left   Past Surgical History:  Procedure Laterality Date   CESAREAN SECTION N/A 10/17/2020   Procedure: CESAREAN SECTION;  Surgeon: Sherlyn Hay, DO;  Location: [facility];  Service: Obstetrics;  Laterality: N/A;   KNEE ARTHROSCOPY Left 10/27/2014   Procedure: LEFT KNEE SCOPE SYNOVECTOMY;  Surgeon: Johnny Bridge, MD;  Location: [facility];  Service: Orthopedics;  Laterality: Left;   MASS EXCISION Left 10/27/2014   Procedure: LEFT PCL CYST EXCISION ;  Surgeon: Johnny Bridge, MD;  Location: [facility];  Service: Orthopedics;  Laterality: Left;   UPPER GI ENDOSCOPY     Social History   Tobacco Use   Smoking status: Never   Smokeless tobacco: Never  Vaping Use   Vaping Use: Never used  Substance Use Topics   Alcohol use: No    Alcohol/week: 0.0 standard drinks of alcohol   Drug use: No   Family History  Problem Relation Age of Onset   Arthritis Mother    Diabetes Maternal Grandmother    Arthritis Paternal Grandmother    Stroke Father    Stroke Paternal Uncle    Diabetes Paternal Uncle    Allergies   Allergen Reactions   Yasmin [Drospirenone-Ethinyl Estradiol] Nausea Only   Current Outpatient Medications on File Prior to Visit  Medication Sig Dispense Refill   JUNEL FE 1/20 1-20 MG-MCG tablet Take 1 tablet by mouth daily.     meloxicam (MOBIC) 15 MG tablet Take 15 mg by mouth daily as needed.     No current facility-administered medications on file prior to visit.    Review of Systems  Constitutional:  Negative for diaphoresis, fatigue and fever.  Respiratory:  Negative for cough.   Gastrointestinal:  Negative for abdominal distention and abdominal pain.  Genitourinary:  Negative for dysuria and frequency.  Musculoskeletal:  Positive for back pain. Negative for arthralgias, gait problem, joint swelling and neck pain.       Using a higher pillow- ? If making back pain worse  Neurological:  Negative for weakness and numbness.       Objective:   Physical Exam Constitutional:      General: She is not in acute distress.    Appearance: Normal appearance. She is well-developed. She is obese. She is not ill-appearing or diaphoretic.  HENT:     Head: Normocephalic and atraumatic.  Eyes:     General: No scleral icterus.    Conjunctiva/sclera: Conjunctivae normal.     Pupils: Pupils are equal, round, and reactive to light.  Cardiovascular:     Rate and Rhythm: Normal rate and regular rhythm.  Pulmonary:     Effort: Pulmonary effort is normal.     Breath sounds: Normal breath sounds. No wheezing or rales.  Abdominal:     General: Bowel sounds are normal. There is no distension.     Palpations: Abdomen is soft.     Tenderness: There is no abdominal tenderness.  Musculoskeletal:        General: Tenderness present.     Cervical back: Normal range of motion and neck supple. No tenderness.     Thoracic back: Tenderness and bony tenderness present. No swelling, deformity or lacerations. Decreased range of motion. No scoliosis.     Lumbar back: Spasms and tenderness present. No edema  or bony tenderness. Decreased range of motion. Negative right straight leg raise test and negative left straight leg raise test.     Comments: Some bony tenderness/ low TS and upper LS  Tender in L thoracic musculature/ low  No ob scoliosis  Limited flex due to pain Ext and lateral bend is nl  Nl gait No neuro changes   Lymphadenopathy:     Cervical: No cervical adenopathy.  Skin:    General: Skin is warm and dry.  Coloration: Skin is not pale.     Findings: No erythema or rash.  Neurological:     Mental Status: She is alert.     Cranial Nerves: No cranial nerve deficit.     Sensory: No sensory deficit.     Motor: No weakness, atrophy or abnormal muscle tone.     Coordination: Coordination normal.     Gait: Gait normal.     Deep Tendon Reflexes: Reflexes are normal and symmetric. Reflexes normal.     Comments: Negative SLR  Psychiatric:        Mood and Affect: Mood normal.           Assessment & Plan:   Problem List Items Addressed This Visit       Other   Low back pain    Pain has moved a bit higher into low TS area  Bilat No neuro change   Xr TS PT  Meloxicam Flexeril Heat prn   Update if not starting to improve in a week or if worsening        Relevant Medications   meloxicam (MOBIC) 15 MG tablet   cyclobenzaprine (FLEXERIL) 10 MG tablet   Other Relevant Orders   DG Thoracic Spine 2 View   Ambulatory referral to Physical Therapy   Mid back pain - Primary    Pt has acute on chronic low back pain-today is flared up higher (low thoracic area)  Some bone and muscle tenderness- TS film ordered  Disc sympt care with meloxicam , flexeriil (caution of sedation), stretches, gentle heat and walking  Ref to PT (this did not work out his time- will watch her phone for calls) Pend rad rev ER precautions disc  Eventually will need to work on overall fitness and core strength  Lifting her 30 plus lb baby is problematic        Relevant Medications    meloxicam (MOBIC) 15 MG tablet   cyclobenzaprine (FLEXERIL) 10 MG tablet   Other Relevant Orders   DG Thoracic Spine 2 View   Ambulatory referral to Physical Therapy

## 2022-11-07 NOTE — Assessment & Plan Note (Signed)
Pt has acute on chronic low back pain-today is flared up higher (low thoracic area)  Some bone and muscle tenderness- TS film ordered  Disc sympt care with meloxicam , flexeriil (caution of sedation), stretches, gentle heat and walking  Ref to PT (this did not work out his time- will watch her phone for calls) Pend rad rev ER precautions disc  Eventually will need to work on overall fitness and core strength  Lifting her 30 plus lb baby is problematic

## 2022-11-07 NOTE — Assessment & Plan Note (Signed)
Pain has moved a bit higher into low TS area  Bilat No neuro change   Xr TS PT  Meloxicam Flexeril Heat prn   Update if not starting to improve in a week or if worsening

## 2022-11-07 NOTE — Patient Instructions (Addendum)
Xray today  We will call with a result   Take the meloxicam with food once daily (instead of ibuprofen)   I will refill the flexeril - caution of sedatoin   I placed a PT referral  Answer phone for un recognized numbers  If you don't get a call in a week let us know   Do stretches Walk if you can Gentle heat for 10 minutes at a time

## 2022-11-24 DIAGNOSIS — Z130 Encounter for screening for diseases of the blood and blood-forming organs and certain disorders involving the immune mechanism: Secondary | ICD-10-CM | POA: Diagnosis not present

## 2022-11-24 DIAGNOSIS — Z01419 Encounter for gynecological examination (general) (routine) without abnormal findings: Secondary | ICD-10-CM | POA: Diagnosis not present

## 2022-11-26 DIAGNOSIS — Z1231 Encounter for screening mammogram for malignant neoplasm of breast: Secondary | ICD-10-CM | POA: Diagnosis not present

## 2023-06-05 ENCOUNTER — Ambulatory Visit: Payer: BC Managed Care – PPO | Admitting: Internal Medicine

## 2023-06-05 ENCOUNTER — Encounter: Payer: Self-pay | Admitting: Internal Medicine

## 2023-06-05 VITALS — BP 110/80 | HR 78 | Temp 97.7°F | Ht 63.0 in | Wt 240.0 lb

## 2023-06-05 DIAGNOSIS — H109 Unspecified conjunctivitis: Secondary | ICD-10-CM | POA: Insufficient documentation

## 2023-06-05 DIAGNOSIS — H1089 Other conjunctivitis: Secondary | ICD-10-CM | POA: Diagnosis not present

## 2023-06-05 MED ORDER — POLYMYXIN B-TRIMETHOPRIM 10000-0.1 UNIT/ML-% OP SOLN: 1.0000 [drp] | Freq: Four times a day (QID) | OPHTHALMIC | 0 refills | Status: DC

## 2023-06-05 NOTE — Progress Notes (Signed)
Subjective:    Patient ID: Hannah Walton, female    DOB: 21-Jan-1981, 42 y.o.   MRN: [id number]  HPI Here due to left eye redess  Someone at work noticed her eye looking red about 10AM yesterday No trauma No fever No URI symptoms  No makeup in eye  Used visine in eye yesterday and it helped Still some itching  Current Outpatient Medications on File Prior to Visit  Medication Sig Dispense Refill   cyclobenzaprine (FLEXERIL) 10 MG tablet Take 1 tablet (10 mg total) by mouth 3 (three) times daily as needed for muscle spasms (back pain). Caution of sedation 30 tablet 0   JUNEL FE 1/20 1-20 MG-MCG tablet Take 1 tablet by mouth daily.     meloxicam (MOBIC) 15 MG tablet Take 15 mg by mouth daily as needed.     No current facility-administered medications on file prior to visit.    Allergies  Allergen Reactions   Yasmin [Drospirenone-Ethinyl Estradiol] Nausea Only    Past Medical History:  Diagnosis Date   Cyst of knee joint 10/2014   left   Cyst of left knee joint 10/27/2014   History of gastric ulcer    Sinus headache    Synovitis of knee 10/2014   left    Past Surgical History:  Procedure Laterality Date   CESAREAN SECTION N/A 10/17/2020   Procedure: CESAREAN SECTION;  Surgeon: Edwinna Areola, DO;  Location: [facility];  Service: Obstetrics;  Laterality: N/A;   KNEE ARTHROSCOPY Left 10/27/2014   Procedure: LEFT KNEE SCOPE SYNOVECTOMY;  Surgeon: Eulas Post, MD;  Location: [facility];  Service: Orthopedics;  Laterality: Left;   MASS EXCISION Left 10/27/2014   Procedure: LEFT PCL CYST EXCISION ;  Surgeon: Eulas Post, MD;  Location: [facility];  Service: Orthopedics;  Laterality: Left;   UPPER GI ENDOSCOPY      Family History  Problem Relation Age of Onset   Arthritis Mother    Diabetes Maternal Grandmother    Arthritis Paternal Grandmother    Stroke Father    Stroke Paternal Uncle    Diabetes Paternal Uncle      Social History   Socioeconomic History   Marital status: Married    Spouse name: Not on file   Number of children: 1   Years of education: Not on file   Highest education level: Not on file  Occupational History   Occupation: Advertising copywriter    Comment: [employer]  Tobacco Use   Smoking status: Never   Smokeless tobacco: Never  Vaping Use   Vaping status: Never Used  Substance and Sexual Activity   Alcohol use: No    Alcohol/week: 0.0 standard drinks of alcohol   Drug use: No   Sexual activity: Not on file  Other Topics Concern   Not on file  Social History Narrative   Not on file   Social Determinants of Health   Financial Resource Strain: Not on file  Food Insecurity: Not on file  Transportation Needs: Not on file  Physical Activity: Not on file  Stress: Not on file  Social Connections: Not on file  Intimate Partner Violence: Not on file   Review of Systems 42 year old--no symptoms No sinus symptoms--not active now    Objective:   Physical Exam Eyes:     Comments: Minimal conjunctival injection on left No foreign body or edema  Assessment & Plan:

## 2023-06-05 NOTE — Assessment & Plan Note (Signed)
Likely irritative Discussed continuing the visine product If worsens with discharge and more redness, can fill Rx for polytrim

## 2023-10-13 ENCOUNTER — Ambulatory Visit: Payer: Self-pay | Admitting: Family Medicine

## 2023-10-13 ENCOUNTER — Encounter: Payer: Self-pay | Admitting: Family Medicine

## 2023-10-13 ENCOUNTER — Ambulatory Visit: Payer: BC Managed Care – PPO | Admitting: Family Medicine

## 2023-10-13 VITALS — Ht 63.0 in | Wt 232.0 lb

## 2023-10-13 DIAGNOSIS — R21 Rash and other nonspecific skin eruption: Secondary | ICD-10-CM

## 2023-10-13 MED ORDER — PREDNISONE 20 MG PO TABS: ORAL_TABLET | ORAL | 0 refills | Status: DC

## 2023-10-13 NOTE — Telephone Encounter (Signed)
Copied from CRM [id number]. Topic: Clinical - Red Word Triage >> Oct 13, 2023  8:56 AM Hannah Walton wrote: Red Word that prompted transfer to Nurse Triage:  Bit real swelling bad and hurt. Located on left arm and left foot.  Chief Complaint: bug bites Symptoms: bites that are reddish in color to left arm, left eye, left foot Frequency: started 2 days ago Pertinent Negatives: Patient denies fever, sob Disposition: [] ED /[] Urgent Care (no appt availability in office) / [x] Appointment(In office/virtual)/ []  [facility]/ [] Home Care/ [] Refused Recommended Disposition /[] [facility]/ []  Follow-up with PCP Additional Notes: c/o bug bites to left side of body on face, arm and foot.  States doesn't know what bit her but she is very itchy.  Virtual visit made for today.  Instructed to go to ER if becomes worse.   Reason for Disposition  [1] Red or very tender (to touch) area AND [2] getting larger over 48 hours after the bite  Answer Assessment - Initial Assessment Questions 1. LOCATION: "Where are the bites located?"      Left arm and on left foot 2. ONSET: "When did you get bitten?"      Two days ago 3. CAUSE: Why do you suspect bed bugs?" (e.g., recent travel, recent purchase of used clothing)     no 4. REDNESS: "Is the area red or pink?" If Yes, ask: "What size is area of redness?" (inches or cm). "When did the redness start?"     reddish 5. ITCHING: "Does it itch?" If Yes, ask: "How bad is the itch?"    - MILD: doesn't interfere with normal activities   - MODERATE-SEVERE: interferes with work, school, sleep, or other activities      severe 6. SWELLING: "How big is the swelling?" (inches, cm, or compare to coins)     yes 7. OTHER SYMPTOMS: "Do you have any other symptoms?"  (e.g., difficulty breathing, hives)     Denies.  Protocols used: Bed Bug Bite-A-AH

## 2023-10-13 NOTE — Progress Notes (Signed)
Virtual visit completed through caregility or similar program Patient location: on vacation in [place].   Provider location: Adult nurse at [facility], office  Participants: Patient and me (unless stated otherwise below)  Limitations and rationale for visit method d/w patient.  Patient agreed to proceed.  Patient identified by 2 identifiers. If vitals are not listed, then patient was unable to self-report due to a lack of equipment at home via telehealth  CC: follow up  HPI: she was at a [employer] two days ago.  Then had rash/itching on L arm and L eyelid and L foot, noted sx night of 10/11/23, then increased yesterday.  No lip or tongue swelling.  No fevers.  Not SOB.  Normal cap refill on the B hands. Took benadryl last night.  Per patient, symptoms presumed to be related to encounter with multiple flying insects at the park.  Meds and allergies reviewed.   ROS: Per HPI unless specifically indicated in ROS section   NAD Speech wnl  A/P: Rash.  Presumed insect exposure, okay for outpatient follow-up.  No emergent symptoms. Rx sent for prednisone with routine cautions.  She can take additional benadryl if needed.  If worsening, then need in person eval.

## 2023-10-16 DIAGNOSIS — R21 Rash and other nonspecific skin eruption: Secondary | ICD-10-CM | POA: Insufficient documentation

## 2023-10-16 NOTE — Assessment & Plan Note (Signed)
Rash.  Presumed insect exposure, okay for outpatient follow-up.  No emergent symptoms. Rx sent for prednisone with routine cautions.  She can take additional benadryl if needed.  If worsening, then need in person eval.

## 2023-11-13 ENCOUNTER — Ambulatory Visit: Payer: Self-pay | Admitting: Family Medicine

## 2023-11-13 NOTE — Telephone Encounter (Signed)
Thanks Agree with ER precautions

## 2023-11-13 NOTE — Telephone Encounter (Signed)
  Chief Complaint: Numbness Symptoms: Numbness BUE, Bilateral knee swelling Frequency: Past 2 days Pertinent Negatives: Patient denies fever, dizziness, facial numbness. weakness Disposition: [] ED /[] Urgent Care (no appt availability in office) / [x] Appointment(In office/virtual)/ []  [facility]/ [] Home Care/ [] Refused Recommended Disposition /[] [facility]/ []  Follow-up with PCP Additional Notes: Pt reports she has been experiencing bilateral upper arm numbness intermittently for the past 2 days. She notes it comes and goes and she experiences mild weakness but denies facial numbness/tingling, slurred speech, vision changes, N/V, HA. Pt also reports she feels tightness and swelling in bilateral knees. OV scheduled for 01/27. This RN educated pt on home care, new-worsening symptoms, when to call back/seek emergent care. Pt verbalized understanding and agrees to plan.    Copied from CRM [id number]. Topic: Clinical - Red Word Triage >> Nov 13, 2023 11:39 AM Hannah Walton wrote: Red Word that prompted transfer to Nurse Triage: Issues with arm feeling very numb & feet itchy , around knee tight Reason for Disposition  [1] Numbness or tingling on both sides of body AND [2] is a new symptom present > 24 hours  Answer Assessment - Initial Assessment Questions 1. SYMPTOM: "What is the main symptom you are concerned about?" (e.g., weakness, numbness)     Bilateral UE numbness 2. ONSET: "When did this start?" (minutes, hours, days; while sleeping)     2 days ago 3. LAST NORMAL: "When was the last time you (the patient) were normal (no symptoms)?"     Intermittent, Now 4. PATTERN "Does this come and go, or has it been constant since it started?"  "Is it present now?"     Comes and goes "Eases up a little bit" 5. CARDIAC SYMPTOMS: "Have you had any of the following symptoms: chest pain, difficulty breathing, palpitations?"     None 6. NEUROLOGIC SYMPTOMS: "Have you had any of the  following symptoms: headache, dizziness, vision loss, double vision, changes in speech, unsteady on your feet?"     None 7. OTHER SYMPTOMS: "Do you have any other symptoms?"     Bilateral knee "tightness" and swelling  Protocols used: Neurologic Deficit-A-AH

## 2023-11-13 NOTE — Telephone Encounter (Signed)
Will route to PCP as a FYI and also Tabitha who is seeing pt Monday

## 2023-11-15 NOTE — Telephone Encounter (Signed)
Thanks for update. Look forward to seeing her.

## 2023-11-16 ENCOUNTER — Ambulatory Visit: Payer: BC Managed Care – PPO | Admitting: Family

## 2023-11-16 ENCOUNTER — Encounter: Payer: Self-pay | Admitting: Family

## 2023-11-16 VITALS — BP 112/82 | HR 82 | Temp 98.5°F | Ht 63.0 in | Wt 236.8 lb

## 2023-11-16 DIAGNOSIS — M542 Cervicalgia: Secondary | ICD-10-CM

## 2023-11-16 DIAGNOSIS — L509 Urticaria, unspecified: Secondary | ICD-10-CM | POA: Diagnosis not present

## 2023-11-16 DIAGNOSIS — M62838 Other muscle spasm: Secondary | ICD-10-CM | POA: Diagnosis not present

## 2023-11-16 MED ORDER — CYCLOBENZAPRINE HCL 10 MG PO TABS: ORAL_TABLET | ORAL | 0 refills | Status: AC

## 2023-11-16 MED ORDER — PREDNISONE 10 MG (21) PO TBPK: ORAL_TABLET | ORAL | 0 refills | Status: DC

## 2023-11-16 NOTE — Progress Notes (Signed)
Established Patient Office Visit  Subjective:   Patient ID: Hannah Walton, female    DOB: January 21, 1981  Age: 43 y.o. MRN: [id number]  CC:  Chief Complaint  Patient presents with   Acute Visit    Bilateral upper extremity numbness x5 days that "comes and goes"    HPI: Hannah Walton is a 43 y.o. female presenting on 11/16/2023 for Acute Visit (Bilateral upper extremity numbness x5 days that "comes and goes")  Five days ago started with numbness bil arms, and then four days ago started with itching feet. She also had noticed a rash all over her body, took some benadryl with relief and states no longer has noticed the rash. The rash was hive like with welts on her check and trunk. She did use triamcinolone cream. She has not had symptoms for her hand numbness since two days ago after she took an ibuprofen. She is on computer at work but is not always constantly typing. 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 She has had this happen in the past with the numbness in both of the arms, it seems to come and go when it does happen. Radiates from the bil upper shoulders and radiates down her arms to the tips of her fingers. The pain she described as 'electrical'  A few nights ago for one day she even felt her bil knees 'tighten up' and felt swollen. They have since went away.   She has not changed perfume, body wash, and or detergent. Uses the same toiletries she typically uses.           ROS: Negative unless specifically indicated above in HPI.   Relevant past medical history reviewed and updated as indicated.   Allergies and medications reviewed and updated.   Current Outpatient Medications:    JUNEL FE 1/20 1-20 MG-MCG tablet, Take 1 tablet by mouth daily., Disp: , Rfl:    predniSONE (STERAPRED UNI-PAK 21 TAB) 10 MG (21) TBPK tablet, Take as directed, Disp: 1 each, Rfl: 0   cyclobenzaprine (FLEXERIL) 10 MG tablet, Take one po  at bedtime prn muscle spasm, Disp: 30 tablet, Rfl: 0  Allergies  Allergen Reactions   Yasmin [Drospirenone-Ethinyl Estradiol] Nausea Only    Objective:   BP 112/82 (BP Location: Left Arm, Patient Position: Sitting, Cuff Size: Large)   Pulse 82   Temp 98.5 F (36.9 C) (Temporal)   Ht 5\' 3"  (1.6 m)   Wt 236 lb 12.8 oz (107.4 kg)   SpO2 100%   BMI 41.95 kg/m    Physical Exam Constitutional:      General: She is not in acute distress.    Appearance: Normal appearance. She is normal weight. She is not ill-appearing, toxic-appearing or diaphoretic.  HENT:     Head: Normocephalic.  Cardiovascular:     Rate and Rhythm: Normal rate.  Pulmonary:     Effort: Pulmonary effort is normal.  Musculoskeletal:     Cervical back: Pain with movement, spinous process tenderness and muscular tenderness present.  Skin:    Comments: No rash visualized   Neurological:     General: No focal deficit present.     Mental Status: She is alert and oriented to person, place, and time. Mental status is at baseline.  Psychiatric:        Mood and Affect: Mood normal.        Behavior: Behavior normal.  Thought Content: Thought content normal.        Judgment: Judgment normal.     Assessment & Plan:  Urticaria -     predniSONE; Take as directed  Dispense: 1 each; Refill: 0  Cervicalgia Assessment & Plan: Neck exercises sent to mychart.  Prednisone pack in case symptoms reemerge  Heat to site as need Refilled flexeril , prn use  Tylenol prn  For urticaria (no longer present) will also benefit from prednisone if needed   Orders: -     predniSONE; Take as directed  Dispense: 1 each; Refill: 0 -     Cyclobenzaprine HCl; Take one po at bedtime prn muscle spasm  Dispense: 30 tablet; Refill: 0  Muscle spasm -     Cyclobenzaprine HCl; Take one po at bedtime prn muscle spasm  Dispense: 30 tablet; Refill: 0     Follow up plan: Return for f/u PCP if no improvement in symptoms.  Mort Sawyers, FNP

## 2023-11-16 NOTE — Assessment & Plan Note (Signed)
Neck exercises sent to mychart.  Prednisone pack in case symptoms reemerge  Heat to site as need Refilled flexeril , prn use  Tylenol prn  For urticaria (no longer present) will also benefit from prednisone if needed

## 2024-02-01 DIAGNOSIS — Z1231 Encounter for screening mammogram for malignant neoplasm of breast: Secondary | ICD-10-CM | POA: Diagnosis not present

## 2024-02-01 DIAGNOSIS — Z13220 Encounter for screening for lipoid disorders: Secondary | ICD-10-CM | POA: Diagnosis not present

## 2024-02-01 DIAGNOSIS — Z01419 Encounter for gynecological examination (general) (routine) without abnormal findings: Secondary | ICD-10-CM | POA: Diagnosis not present

## 2024-02-01 DIAGNOSIS — Z13228 Encounter for screening for other metabolic disorders: Secondary | ICD-10-CM | POA: Diagnosis not present

## 2024-02-01 DIAGNOSIS — Z130 Encounter for screening for diseases of the blood and blood-forming organs and certain disorders involving the immune mechanism: Secondary | ICD-10-CM | POA: Diagnosis not present

## 2024-02-01 DIAGNOSIS — E559 Vitamin D deficiency, unspecified: Secondary | ICD-10-CM | POA: Diagnosis not present

## 2024-02-01 LAB — HM MAMMOGRAPHY

## 2024-02-02 LAB — LAB REPORT - SCANNED
A1c: 6.2
EGFR: 105

## 2024-02-09 DIAGNOSIS — Z6841 Body Mass Index (BMI) 40.0 and over, adult: Secondary | ICD-10-CM | POA: Diagnosis not present

## 2024-02-22 DIAGNOSIS — Z1331 Encounter for screening for depression: Secondary | ICD-10-CM | POA: Diagnosis not present

## 2024-02-22 DIAGNOSIS — Z6841 Body Mass Index (BMI) 40.0 and over, adult: Secondary | ICD-10-CM | POA: Diagnosis not present

## 2024-03-02 DIAGNOSIS — Z6841 Body Mass Index (BMI) 40.0 and over, adult: Secondary | ICD-10-CM | POA: Diagnosis not present

## 2024-03-28 ENCOUNTER — Encounter: Payer: Self-pay | Admitting: Podiatry

## 2024-03-28 ENCOUNTER — Ambulatory Visit: Admitting: Podiatry

## 2024-03-28 DIAGNOSIS — L600 Ingrowing nail: Secondary | ICD-10-CM | POA: Diagnosis not present

## 2024-03-28 NOTE — Progress Notes (Signed)
  Subjective:  Patient ID: Hannah Walton, female    DOB: [birth year],  MRN: [id number]  Chief Complaint  Patient presents with   Nail Problem    "My right big toenail is hurting bad.  I don't understand why."    43 y.o. female presents with the above complaint. History confirmed with patient.  Felt like she was starting to get a little bit of an ingrown again, thought about getting a pedicure but wanted to wait and have examined first  Objective:  Physical Exam: warm, good capillary refill, no trophic changes or ulcerative lesions, normal DP and PT pulses, normal sensory exam, and no proximal or deep ingrown nail on either hallux no infection, right hallux nail has some new incurvation medially.  Assessment:   1. Ingrowing right great toenail      Plan:  Patient was evaluated and treated and all questions answered.  Discussed treatment options this was recently started and was fairly mild in appearance and had no signs of infection.  She would like to proceed without removing more of the nail plate at this point I recommended Epsom salt soaks and having a pedicure to alleviate the offending border, if this does not help or worsens then she will let me know and we will proceed with more additional medial matricectomy of the right hallux nail.  Return if symptoms worsen or fail to improve.

## 2024-05-03 DIAGNOSIS — E559 Vitamin D deficiency, unspecified: Secondary | ICD-10-CM | POA: Diagnosis not present

## 2024-06-14 ENCOUNTER — Ambulatory Visit: Payer: Self-pay

## 2024-06-14 DIAGNOSIS — H1045 Other chronic allergic conjunctivitis: Secondary | ICD-10-CM | POA: Diagnosis not present

## 2024-06-14 NOTE — Telephone Encounter (Signed)
 FYI Only or Action Required?: FYI only for provider.  Patient was last seen in primary care on 11/16/2023 by Corwin Antu, FNP.  Called Nurse Triage reporting Chest Pain.  Symptoms began several weeks ago.  Interventions attempted: OTC medications: antiacids.  Symptoms are: gradually worsening .  Triage Disposition: Go to ED Now (or PCP Triage)  Patient/caregiver understands and will follow disposition?: YesCopied from CRM [id number]. Topic: Clinical - Red Word Triage >> Jun 14, 2024  2:20 PM [place] J wrote: [facility] that prompted transfer to Nurse Triage: Patient is having back pain and shocks in her chest area. Reason for Disposition  [1] Chest pain lasts > 5 minutes AND [2] occurred in past 3 days (72 hours) (Exception: Feels exactly the same as previously diagnosed heartburn and has accompanying sour taste in mouth.)  Answer Assessment - Initial Assessment Questions Hurts when pt eats and also sporadically. RN advised ED with random chest pains and lightheadedness. Pt stated she will go.      1. LOCATION: Where does it hurt?       Left and right-switches randomly  2. RADIATION: Does the pain go anywhere else? (e.g., into neck, jaw, arms, back)     Radiates left to right  3. ONSET: When did the chest pain begin? (Minutes, hours or days)      3 weeks  4. PATTERN: Does the pain come and go, or has it been constant since it started?  Does it get worse with exertion?      Comes and goes 5. DURATION: How long does it last (e.g., seconds, minutes, hours)     10 mins 6. SEVERITY: How bad is the pain?  (e.g., Scale 1-10; mild, moderate, or severe)     5 7. CARDIAC RISK FACTORS: Do you have any history of heart problems or risk factors for heart disease? (e.g., angina, prior heart attack; diabetes, high blood pressure, high cholesterol, smoker, or strong family history of heart disease)     DM 8. PULMONARY RISK FACTORS: Do you have any history of lung disease?   (e.g., blood clots in lung, asthma, emphysema, birth control pills)     denies 9. CAUSE: What do you think is causing the chest pain?     Not sure 10. OTHER SYMPTOMS: Do you have any other symptoms? (e.g., dizziness, nausea, vomiting, sweating, fever, difficulty breathing, cough)       Lightheaded-unrelated to chest pain  Protocols used: Chest Pain-A-AH

## 2024-06-14 NOTE — Telephone Encounter (Signed)
 Agree with disposition While the chest pain may be GI in nature that does not explain the light headedness   Aware, will watch for correspondence

## 2024-06-27 ENCOUNTER — Ambulatory Visit: Payer: Self-pay

## 2024-06-27 DIAGNOSIS — L03115 Cellulitis of right lower limb: Secondary | ICD-10-CM | POA: Diagnosis not present

## 2024-06-27 DIAGNOSIS — X58XXXA Exposure to other specified factors, initial encounter: Secondary | ICD-10-CM | POA: Diagnosis not present

## 2024-06-27 DIAGNOSIS — T63421A Toxic effect of venom of ants, accidental (unintentional), initial encounter: Secondary | ICD-10-CM | POA: Diagnosis not present

## 2024-06-27 NOTE — Telephone Encounter (Signed)
 Aware, will watch for correspondence

## 2024-06-27 NOTE — Telephone Encounter (Signed)
 FYI Only or Action Required?: FYI only for provider.  Patient was last seen in primary care on 11/16/2023 by Corwin Antu, FNP.  Called Nurse Triage reporting Insect Bite.  Symptoms began several days ago.  Interventions attempted: OTC medications: Benadryl , Zyrtec, hydrocortisone ointment, calamine lotion, Rest, hydration, or home remedies, and Ice/heat application.  Symptoms are: unchanged.  Triage Disposition: See Physician Within 24 Hours (overriding See PCP When Office is Open (Within 3 Days))  Patient/caregiver understands and will follow disposition?: Yes                             Copied from CRM [id number]. Topic: Clinical - Red Word Triage >> Jun 27, 2024  9:48 AM Avram MATSU wrote: [facility] that prompted transfer to Nurse Triage: bug bite by red ants on ankles, feels funny and chills throughout the body, itchy as well. Reason for Disposition  [1] SEVERE local itching (e.g., interferes with work, school, sleep) AND [2] not improved after 24 hours of hydrocortisone cream  Answer Assessment - Initial Assessment Questions 1. SEVERITY: How many stings are there?     Multiple red blisters 2. ONSET: When did it occur?      Saturday 3. LOCATION: Where is the sting located?  How many stings?     Right ankle  4. SWELLING: How big is the swelling? (e.g., inches or cm)     Mild swelling 5. REDNESS: Is the area red or pink? If Yes, ask: What size is the area of redness? (e.g., inches or cm). When did the redness start?     Yes 6. PAIN:  Is there any pain? If Yes, ask: How bad is it?  (Scale 0-10; or none, mild, moderate, severe)     Rates pain an 8  7. ITCHING: Is there any itching? If Yes, ask: How bad is it?      Moderate-severe itching  8. RESPIRATORY DISTRESS: Describe your breathing.     Denies difficulty breathing 10. OTHER SYMPTOMS: Do you have any other symptoms? (e.g., abdomen pain, face or tongue swelling, new rash  elsewhere, vomiting)    Chills, denies facial swelling, denies swelling of airway    No availability in office today. Patient declined going to another office. This RN advised UC. Patient verbalized understanding and agreed to go.  Protocols used: [employer], Insect Bite-A-AH

## 2024-06-28 ENCOUNTER — Ambulatory Visit: Payer: Self-pay | Admitting: *Deleted

## 2024-06-28 ENCOUNTER — Other Ambulatory Visit: Payer: Self-pay | Admitting: Family Medicine

## 2024-06-28 MED ORDER — DOXYCYCLINE HYCLATE 100 MG PO TABS: 100.0000 mg | ORAL_TABLET | Freq: Two times a day (BID) | ORAL | 0 refills | Status: DC

## 2024-06-28 NOTE — Telephone Encounter (Signed)
 FYI Only or Action Required?: Action required by provider: clinical question for provider and if patient should stop taking cephalexin due to headache after taking medication. See UC visit 06/27/24 in chart  Patient was last seen in primary care on 11/16/2023 by Corwin Antu, FNP.  Called Nurse Triage reporting Medication Problem.  Symptoms began yesterday.  Interventions attempted: Prescription medications: cephalexin and steroid cream.  Symptoms are: gradually worsening.  Triage Disposition: Call PCP Now  Patient/caregiver understands and will follow disposition?: No, wishes to speak with PCP   Patient requesting call back on mobile # due to she is at work.            Copied from CRM [id number]. Topic: Clinical - Red Word Triage >> Jun 28, 2024 12:15 PM Frederich PARAS wrote: [facility] that prompted transfer to Nurse Triage: head pain  PT Calling,she went to [facility] do to red ant bites, she got sterhoid creme,and, cephalexin 500mg   , making head hurt , pt is in pain. Reason for Disposition  [1] Caller has URGENT medicine question about med that primary care doctor (or NP/PA) or specialist prescribed AND [2] triager unable to answer question  Answer Assessment - Initial Assessment Questions Possible medication reaction. Headache with blurred vision noted after taking dose of cephalexin 500 mg and or steroid cream for ant bites. Recommended patient not to take medication until PCP responds. Please advise if patient should stop medication and if another medication can be given. Please advise   Recommended stay hydrated. Check BP if possible. If sx of headache return or worsen go to ED. Recommended to contact [facility] / provider who ordered medication of side effects. Patient denies weakness , N/T either side of body or face. No slurred speech.       1. NAME of MEDICINE: What medicine(s) are you calling about?     Cephalexin 500 mg  and steroid cream from [facility] for ant bites 2.  QUESTION: What is your question? (e.g., double dose of medicine, side effect)     Should patient continue taking cephalexin and or steroid cream due to headaches after taking medication? 3. PRESCRIBER: Who prescribed the medicine? Reason: if prescribed by specialist, call should be referred to that group.     [facility] provider DOROTHA Jenny, PA 4. SYMPTOMS: Do you have any symptoms? If Yes, ask: What symptoms are you having?  How bad are the symptoms (e.g., mild, moderate, severe)     Headache blurred vision better now but caused after taking antibiotics. Moderate headache. 5. PREGNANCY:  Is there any chance that you are pregnant? When was your last menstrual period?     na  Protocols used: Medication Question Call-A-AH

## 2024-06-28 NOTE — Telephone Encounter (Signed)
 Let her know I sent in doxycycine Stop cephalexin and start the doxy  Let us  know if that is not better tolerated  Also if this does not help the skin infection

## 2024-06-28 NOTE — Telephone Encounter (Signed)
Pt notified of Dr. Tower's comments and verbalized understanding  

## 2024-07-01 ENCOUNTER — Encounter (HOSPITAL_COMMUNITY): Payer: Self-pay

## 2024-07-01 ENCOUNTER — Ambulatory Visit (HOSPITAL_COMMUNITY)
Admission: EM | Admit: 2024-07-01 | Discharge: 2024-07-01 | Disposition: A | Discharge status: Home / Self Care | Attending: Family Medicine | Admitting: Family Medicine

## 2024-07-01 DIAGNOSIS — H538 Other visual disturbances: Secondary | ICD-10-CM | POA: Diagnosis not present

## 2024-07-01 DIAGNOSIS — G43C1 Periodic headache syndromes in child or adult, intractable: Secondary | ICD-10-CM | POA: Diagnosis not present

## 2024-07-01 LAB — POCT FASTING CBG KUC MANUAL ENTRY: POCT Glucose (KUC): 100 mg/dL — AB (ref 70–99)

## 2024-07-01 MED ORDER — KETOROLAC TROMETHAMINE 30 MG/ML IJ SOLN
30.0000 mg | Freq: Once | INTRAMUSCULAR | Status: AC
  Administered 2024-07-01: 30 mg via INTRAMUSCULAR

## 2024-07-01 MED ORDER — ONDANSETRON 4 MG PO TBDP
ORAL_TABLET | ORAL | Status: AC
  Filled 2024-07-01: qty 1

## 2024-07-01 MED ORDER — KETOROLAC TROMETHAMINE 30 MG/ML IJ SOLN
INTRAMUSCULAR | Status: AC
  Filled 2024-07-01: qty 1

## 2024-07-01 MED ORDER — SUMATRIPTAN SUCCINATE 6 MG/0.5ML ~~LOC~~ SOLN
SUBCUTANEOUS | Status: AC
  Filled 2024-07-01: qty 0.5

## 2024-07-01 MED ORDER — ONDANSETRON 4 MG PO TBDP
4.0000 mg | ORAL_TABLET | Freq: Once | ORAL | Status: AC
  Administered 2024-07-01: 4 mg via ORAL

## 2024-07-01 MED ORDER — SUMATRIPTAN SUCCINATE 6 MG/0.5ML ~~LOC~~ SOLN
6.0000 mg | Freq: Once | SUBCUTANEOUS | Status: AC
  Administered 2024-07-01: 6 mg via SUBCUTANEOUS

## 2024-07-01 NOTE — ED Provider Notes (Signed)
 [facility]    CSN: [id number] Arrival date & time: 07/01/24  1240      History   Chief Complaint Chief Complaint  Patient presents with   Headache    HPI Floye L Oliver-McCollum is a 43 y.o. female.    Headache Here with right sided headache has been going on for about 2 days.  She does have some nausea and photophobia with it.  No fever or cold symptoms.  She also has had some blurry vision  For a few weeks she has had intermittent sharp chest pain sometimes on the right sometimes on the left.  She is currently on doxycycline  and prednisone  for some skin infection and cellulitis and insect bites.  Those are improving.  Cephalexin causes a headache and she is intolerant of Yasmin    Past Medical History:  Diagnosis Date   Cyst of knee joint 10/2014   left   Cyst of left knee joint 10/27/2014   History of gastric ulcer    Sinus headache    Synovitis of knee 10/2014   left    Patient Active Problem List   Diagnosis Date Noted   Mid back pain 11/07/2022   GERD (gastroesophageal reflux disease) 08/25/2022   Paresthesia of foot 02/03/2022   Left foot pain 02/03/2022   Paresthesia of hand 01/17/2021   S/P cesarean section 10/18/2020   Indication for care in labor and delivery, antepartum 10/16/2020   Generalized abdominal pain 03/16/2020   Elevated BP without diagnosis of hypertension 05/12/2019   Headache 05/12/2019   Paresthesia of left arm 05/11/2017   Amenorrhea 11/26/2015   Encounter for routine gynecological examination 05/16/2015   Other constipation 03/07/2014   Low back pain 12/17/2006    Past Surgical History:  Procedure Laterality Date   CESAREAN SECTION N/A 10/17/2020   Procedure: CESAREAN SECTION;  Surgeon: Delana Ted Morrison, DO;  Location: [facility];  Service: Obstetrics;  Laterality: N/A;   KNEE ARTHROSCOPY Left 10/27/2014   Procedure: LEFT KNEE SCOPE SYNOVECTOMY;  Surgeon: Fonda SHAUNNA Olmsted, MD;  Location: [facility];   Service: Orthopedics;  Laterality: Left;   MASS EXCISION Left 10/27/2014   Procedure: LEFT PCL CYST EXCISION ;  Surgeon: Fonda SHAUNNA Olmsted, MD;  Location:  SURGERY CENTER;  Service: Orthopedics;  Laterality: Left;   UPPER GI ENDOSCOPY      OB History     Gravida  1   Para  1   Term  1   Preterm  0   AB  0   Living  1      SAB  0   IAB  0   Ectopic  0   Multiple  0   Live Births  1            Home Medications    Prior to Admission medications   Medication Sig Start Date End Date Taking? Authorizing Provider  azelastine (OPTIVAR) 0.05 % ophthalmic solution Apply 1 drop to eye 2 (two) times daily. 06/14/24  Yes [provider]  prednisoLONE acetate (PRED FORTE) 1 % ophthalmic suspension Place 1 drop into both eyes in the morning, at noon, and at bedtime. 06/14/24  Yes [provider]  triamcinolone  cream (KENALOG ) 0.1 % Apply 1 Application topically 2 (two) times daily. 06/27/24  Yes [provider]  Cholecalciferol (VITAMIN D3) 1.25 MG (50000 UT) CAPS Take 1 capsule by mouth once a week.    [provider]  cyclobenzaprine  (FLEXERIL ) 10 MG  tablet Take one po at bedtime prn muscle spasm 11/16/23   Dugal, Tabitha, FNP  doxycycline  (VIBRA -TABS) 100 MG tablet Take 1 tablet (100 mg total) by mouth 2 (two) times daily. 06/28/24   Tower, Laine LABOR, MD  doxycycline  (VIBRA -TABS) 100 MG tablet Take 1 tablet (100 mg total) by mouth 2 (two) times daily. 06/28/24   Tower, Laine LABOR, MD  JUNEL FE 1/20 1-20 MG-MCG tablet Take 1 tablet by mouth daily. 03/23/21   [provider]    Family History Family History  Problem Relation Age of Onset   Arthritis Mother    Diabetes Maternal Grandmother    Arthritis Paternal Grandmother    Stroke Father    Stroke Paternal Uncle    Diabetes Paternal Uncle     Social History Social History   Tobacco Use   Smoking status: Never   Smokeless tobacco: Never  Vaping Use   Vaping status: Never Used   Substance Use Topics   Alcohol use: No    Alcohol/week: 0.0 standard drinks of alcohol   Drug use: No     Allergies   Cephalexin and Yasmin  [drospirenone -ethinyl estradiol ]   Review of Systems Review of Systems  Neurological:  Positive for headaches.     Physical Exam Triage Vital Signs ED Triage Vitals  Encounter Vitals Group     BP 07/01/24 1343 (!) 142/92     Girls Systolic BP Percentile --      Girls Diastolic BP Percentile --      Boys Systolic BP Percentile --      Boys Diastolic BP Percentile --      Pulse Rate 07/01/24 1343 87     Resp 07/01/24 1343 16     Temp 07/01/24 1343 98.6 F (37 C)     Temp Source 07/01/24 1343 Oral     SpO2 07/01/24 1343 97 %     Weight --      Height --      Head Circumference --      Peak Flow --      Pain Score 07/01/24 1346 10     Pain Loc --      Pain Education --      Exclude from Growth Chart --    No data found.  Updated Vital Signs BP (!) 142/92 (BP Location: Right Arm)   Pulse 87   Temp 98.6 F (37 C) (Oral)   Resp 16   LMP 06/25/2024 (Approximate)   SpO2 97%   Visual Acuity Right Eye Distance: 20/200 Left Eye Distance: 20/70 Bilateral Distance: 20/70  Right Eye Near:   Left Eye Near:    Bilateral Near:     Physical Exam Vitals reviewed.  Constitutional:      General: She is not in acute distress.    Appearance: She is not ill-appearing, toxic-appearing or diaphoretic.  HENT:     Right Ear: Tympanic membrane and ear canal normal.     Left Ear: Tympanic membrane and ear canal normal.     Nose: Nose normal.     Mouth/Throat:     Mouth: Mucous membranes are moist.     Pharynx: No oropharyngeal exudate or posterior oropharyngeal erythema.  Eyes:     Extraocular Movements: Extraocular movements intact.     Conjunctiva/sclera: Conjunctivae normal.     Pupils: Pupils are equal, round, and reactive to light.  Cardiovascular:     Rate and Rhythm: Normal rate and regular rhythm.     Heart sounds:  No  murmur heard. Pulmonary:     Effort: No respiratory distress.     Breath sounds: No stridor. No wheezing, rhonchi or rales.  Chest:     Chest wall: No tenderness.  Musculoskeletal:     Cervical back: Neck supple.  Lymphadenopathy:     Cervical: No cervical adenopathy.  Skin:    Capillary Refill: Capillary refill takes less than 2 seconds.     Coloration: Skin is not jaundiced or pale.  Neurological:     General: No focal deficit present.     Mental Status: She is alert and oriented to person, place, and time.     Cranial Nerves: No cranial nerve deficit.  Psychiatric:        Behavior: Behavior normal.      UC Treatments / Results  Labs (all labs ordered are listed, but only abnormal results are displayed) Labs Reviewed  POCT FASTING CBG [facility] MANUAL ENTRY - Abnormal; Notable for the following components:      Result Value   POCT Glucose ([facility]) 100 (*)    All other components within normal limits    EKG   Radiology No results found.  Procedures Procedures (including critical care time)  Medications Ordered in UC Medications  ketorolac  (TORADOL ) 30 MG/ML injection 30 mg (30 mg Intramuscular Given 07/01/24 1508)  SUMAtriptan  (IMITREX ) injection 6 mg (6 mg Subcutaneous Given 07/01/24 1509)  ondansetron  (ZOFRAN -ODT) disintegrating tablet 4 mg (4 mg Oral Given 07/01/24 1508)    Initial Impression / Assessment and Plan / UC Course  I have reviewed the triage vital signs and the nursing notes.  Pertinent labs & imaging results that were available during my care of the patient were reviewed by me and considered in my medical decision making (see chart for details).     Glucose is 100.  That was done due to the blurry vision symptom.  Toradol , Imitrex , and Zofran  are given here for what appears to be a migraine headache.  She has an appointment set up with her primary care and so she will follow-up with them about all the symptoms next week. Final Clinical Impressions(s)  / UC Diagnoses   Final diagnoses:  Intractable periodic headache syndrome  Blurry vision     Discharge Instructions      Your sugar was 100, which is normal  You have been given a shot of Toradol  30 mg, sumatriptan  6 mg, and ondansetron  4 mg today.     ED Prescriptions   None    PDMP not reviewed this encounter.   Vonna Sharlet POUR, MD 07/01/24 [phone]

## 2024-07-01 NOTE — Discharge Instructions (Addendum)
 Your sugar was 100, which is normal  You have been given a shot of Toradol  30 mg, sumatriptan  6 mg, and ondansetron  4 mg today.

## 2024-07-01 NOTE — ED Triage Notes (Signed)
 Patient here today with c/o headache, nausea, and blurry vision off and on X 2 days. She has taken Ibuprofen  with very little relief. Patient also states that she has some sharp chest pain in and out. Patient recently started taking an antibiotic and prednisolone for insect bites and using eye drops for some eye swelling.

## 2024-07-03 ENCOUNTER — Encounter: Payer: Self-pay | Admitting: Family Medicine

## 2024-07-03 ENCOUNTER — Other Ambulatory Visit: Payer: Self-pay

## 2024-07-03 ENCOUNTER — Emergency Department
Admission: EM | Admit: 2024-07-03 | Discharge: 2024-07-04 | Disposition: A | Discharge status: Home / Self Care | Attending: Emergency Medicine | Admitting: Emergency Medicine

## 2024-07-03 DIAGNOSIS — R519 Headache, unspecified: Secondary | ICD-10-CM | POA: Diagnosis not present

## 2024-07-03 DIAGNOSIS — E871 Hypo-osmolality and hyponatremia: Secondary | ICD-10-CM | POA: Insufficient documentation

## 2024-07-03 LAB — BASIC METABOLIC PANEL WITH GFR
Anion gap: 9 (ref 5–15)
BUN: 9 mg/dL (ref 6–20)
CO2: 24 mmol/L (ref 22–32)
Calcium: 8.5 mg/dL — ABNORMAL LOW (ref 8.9–10.3)
Chloride: 99 mmol/L (ref 98–111)
Creatinine, Ser: 0.77 mg/dL (ref 0.44–1.00)
GFR, Estimated: >60 mL/min (ref >60)
Glucose, Bld: 96 mg/dL (ref 70–99)
Potassium: 3.7 mmol/L (ref 3.5–5.1)
Sodium: 132 mmol/L — ABNORMAL LOW (ref 135–145)

## 2024-07-03 LAB — CBC WITH DIFFERENTIAL/PLATELET
Abs Immature Granulocytes: 0.02 K/uL (ref 0.00–0.07)
Basophils Absolute: 0 K/uL (ref 0.0–0.1)
Basophils Relative: 0 %
Eosinophils Absolute: 0 K/uL (ref 0.0–0.5)
Eosinophils Relative: 0 %
HCT: 34.1 % — ABNORMAL LOW (ref 36.0–46.0)
Hemoglobin: 10.9 g/dL — ABNORMAL LOW (ref 12.0–15.0)
Immature Granulocytes: 0 %
Lymphocytes Relative: 44 %
Lymphs Abs: 3.2 K/uL (ref 0.7–4.0)
MCH: 26.1 pg (ref 26.0–34.0)
MCHC: 32 g/dL (ref 30.0–36.0)
MCV: 81.8 fL (ref 80.0–100.0)
Monocytes Absolute: 0.9 K/uL (ref 0.1–1.0)
Monocytes Relative: 12 %
Neutro Abs: 3.2 K/uL (ref 1.7–7.7)
Neutrophils Relative %: 44 %
Platelets: 250 K/uL (ref 150–400)
RBC: 4.17 MIL/uL (ref 3.87–5.11)
RDW: 12.8 % (ref 11.5–15.5)
WBC: 7.3 K/uL (ref 4.0–10.5)
nRBC: 0 % (ref 0.0–0.2)

## 2024-07-03 MED ORDER — SODIUM CHLORIDE 0.9 % IV BOLUS
500.0000 mL | Freq: Once | INTRAVENOUS | Status: AC
  Administered 2024-07-04: 500 mL via INTRAVENOUS

## 2024-07-03 MED ORDER — PROCHLORPERAZINE EDISYLATE 10 MG/2ML IJ SOLN
10.0000 mg | INTRAMUSCULAR | Status: AC
  Administered 2024-07-04: 10 mg via INTRAVENOUS
  Filled 2024-07-03: qty 2

## 2024-07-03 MED ORDER — KETOROLAC TROMETHAMINE 30 MG/ML IJ SOLN
15.0000 mg | Freq: Once | INTRAMUSCULAR | Status: AC
  Administered 2024-07-04: 15 mg via INTRAVENOUS
  Filled 2024-07-03: qty 1

## 2024-07-03 MED ORDER — DIPHENHYDRAMINE HCL 50 MG/ML IJ SOLN
12.5000 mg | INTRAMUSCULAR | Status: AC
  Administered 2024-07-04: 12.5 mg via INTRAVENOUS
  Filled 2024-07-03: qty 1

## 2024-07-03 MED ORDER — DEXAMETHASONE SODIUM PHOSPHATE 10 MG/ML IJ SOLN
10.0000 mg | Freq: Once | INTRAMUSCULAR | Status: AC
Start: 2024-07-04 — End: 2024-07-04
  Administered 2024-07-04: 10 mg via INTRAVENOUS
  Filled 2024-07-03: qty 1

## 2024-07-03 NOTE — ED Triage Notes (Addendum)
 Patient ambualtory to triage with complaints of worsening headache x2 days. Patient states she was seen at [facility] and given 2 shots, but they did not help. Had previously tried OTC ibuprofen  but felt it was making her stomach upset so she stopped.

## 2024-07-03 NOTE — ED Provider Notes (Signed)
 [facility] Provider Note    Event Date/Time   First MD Initiated Contact with Patient 07/03/24 2326     (approximate)   History   Headache   HPI Hannah Walton is a 43 y.o. female who presents for evaluation of persistent headache for several days.  She said that she has had migraines before and this feels similar.  She has been dealing with an issue with her eyes with some swelling and eyedrops and she is being managed by her ophthalmologist.  Her headache is developed over the last several days and she went to the urgent care and had a shot of Toradol  and some Imitrex  but it did not seem to help.  She was in contact with a physician at her primary care clinic who recommended she come to the emergency department.  No nausea or vomiting, no thunderclap headache.  No recent trauma.  No numbness or weakness in her extremities.     Physical Exam   Triage Vital Signs: ED Triage Vitals  Encounter Vitals Group     BP 07/03/24 1920 (!) 146/91     Girls Systolic BP Percentile --      Girls Diastolic BP Percentile --      Boys Systolic BP Percentile --      Boys Diastolic BP Percentile --      Pulse Rate 07/03/24 1920 99     Resp 07/03/24 1920 20     Temp 07/03/24 1920 98.2 F (36.8 C)     Temp Source 07/03/24 1920 Oral     SpO2 07/03/24 1920 100 %     Weight 07/03/24 1921 106.6 kg (235 lb)     Height 07/03/24 1921 1.6 m (5' 3)     Head Circumference --      Peak Flow --      Pain Score 07/03/24 1920 9     Pain Loc --      Pain Education --      Exclude from Growth Chart --     Most recent vital signs: Vitals:   07/04/24 0014 07/04/24 0232  BP: (!) 142/83 (!) 137/90  Pulse: 77 91  Resp: 18 18  Temp: 97.9 F (36.6 C) 98.4 F (36.9 C)  SpO2: 100% 97%    General: Awake, no obvious distress.  Pleasant and conversant. Eyes:  Pupils are equal and reactive, extraocular movement is normal.  No chemosis or periorbital edema at this  time. CV:  Good peripheral perfusion.  Resp:  Normal effort. Speaking easily and comfortably, no accessory muscle usage nor intercostal retractions.   Abd:  No distention.  Other:  No neurological deficits appreciated   ED Results / Procedures / Treatments   Labs (all labs ordered are listed, but only abnormal results are displayed) Labs Reviewed  BASIC METABOLIC PANEL WITH GFR - Abnormal; Notable for the following components:      Result Value   Sodium 132 (*)    Calcium 8.5 (*)    All other components within normal limits  CBC WITH DIFFERENTIAL/PLATELET - Abnormal; Notable for the following components:   Hemoglobin 10.9 (*)    HCT 34.1 (*)    All other components within normal limits     RADIOLOGY See ED course for details   PROCEDURES:  Critical Care performed: No  Procedures    IMPRESSION / MDM / ASSESSMENT AND PLAN / ED COURSE  I reviewed the triage vital signs and the nursing notes.  Differential diagnosis includes, but is not limited to, migraine, other nonspecific headache, intracranial hemorrhage, intracranial neoplasm, idiopathic intracranial hypertension, headache due to ocular disturbances.  Patient's presentation is most consistent with acute presentation with potential threat to life or bodily function.  Labs/studies ordered: BMP, CBC with differential, CT head  Interventions/Medications given:  Medications  prochlorperazine  (COMPAZINE ) injection 10 mg (10 mg Intravenous Given 07/04/24 0011)  diphenhydrAMINE  (BENADRYL ) injection 12.5 mg (12.5 mg Intravenous Given 07/04/24 0011)  ketorolac  (TORADOL ) 30 MG/ML injection 15 mg (15 mg Intravenous Given 07/04/24 0011)  sodium chloride  0.9 % bolus 500 mL (0 mLs Intravenous Stopped 07/04/24 0116)  dexamethasone  (DECADRON ) injection 10 mg (10 mg Intravenous Given 07/04/24 0011)    (Note:  hospital course my include additional interventions and/or labs/studies not listed  above.)   Vital signs are stable within normal limits.  Reassuring physical and neurological exam.  Will get CT head for reassurance and treat empirically for migraine with medications as listed above.  Patient agrees with plan.     Clinical Course as of 07/04/24 0239  Mon Jul 04, 2024  0103 CT Head Wo Contrast I independently viewed and interpreted the patient's CT of the head.  I see no indication of neoplasm or intracranial hemorrhage.  I also read the radiologist's report, which confirmed no acute findings. [CF]  [phone] Patient that she feels much better And is ready to go home.  I gave my usual and customary return precautions.  The patient's medical screening exam is reassuring with no indication  condition requiring hospitalization or additional evaluation at this point.  The patient is safe and appropriate for discharge and outpatient follow up. [CF]    Clinical Course User Index [CF] Gordan Huxley, MD     FINAL CLINICAL IMPRESSION(S) / ED DIAGNOSES   Final diagnoses:  Acute nonintractable headache, unspecified headache type     Rx / DC Orders   ED Discharge Orders     None        Note:  This document was prepared using Dragon voice recognition software and may include unintentional dictation errors.   Gordan Huxley, MD 07/04/24 [phone]

## 2024-07-03 NOTE — Progress Notes (Signed)
 labauer on own call  I received call about intractable headache 8 out of 10 despite aggressive treatment in urgent Care yesterday. Ibuprofen  is upsetting her stomach and Tylenol  not helpful. Prior treatment's not helpful yesterday including zofran , toradol  and she is already on a course of prednisone    When I reviewed the notes, my greatest concern was vision abnormalities noted and abnormal visual acuity. I recommended that minimum return to urgent care but potentially emergency department visit

## 2024-07-04 ENCOUNTER — Telehealth: Payer: Self-pay

## 2024-07-04 ENCOUNTER — Emergency Department

## 2024-07-04 NOTE — Discharge Instructions (Signed)

## 2024-07-04 NOTE — Telephone Encounter (Signed)
 Hannah Walton

## 2024-07-04 NOTE — Telephone Encounter (Signed)
Lvm asking pt to call back.  Need to get update on pt.  

## 2024-07-04 NOTE — Telephone Encounter (Signed)
 She did go to ER and had reassuring work up I will see her on 9/17 as planned   Agree with ER precautions if symptoms worsen before then as I am out of the office   Please check in with her  Thanks

## 2024-07-04 NOTE — Telephone Encounter (Deleted)
 SABRA

## 2024-07-06 ENCOUNTER — Ambulatory Visit: Admitting: Family Medicine

## 2024-07-07 NOTE — Telephone Encounter (Signed)
 Left VM requesting pt to call the office back, pt does have an appt scheduled on 07/18/24

## 2024-07-11 NOTE — Telephone Encounter (Signed)
Lvm asking pt to call back.  Need to get update on pt.  

## 2024-07-12 NOTE — Telephone Encounter (Signed)
Lvm asking pt to call back.  Need to get update on pt.  

## 2024-07-13 NOTE — Telephone Encounter (Signed)
Lvm asking pt to call back.  Need to get update on pt.  

## 2024-07-18 ENCOUNTER — Ambulatory Visit: Admitting: Family Medicine

## 2024-07-18 ENCOUNTER — Encounter: Payer: Self-pay | Admitting: Family Medicine

## 2024-07-18 VITALS — BP 132/84 | HR 71 | Temp 98.1°F | Ht 63.0 in | Wt 235.0 lb

## 2024-07-18 DIAGNOSIS — G4452 New daily persistent headache (NDPH): Secondary | ICD-10-CM | POA: Diagnosis not present

## 2024-07-18 DIAGNOSIS — R7303 Prediabetes: Secondary | ICD-10-CM | POA: Diagnosis not present

## 2024-07-18 NOTE — Assessment & Plan Note (Signed)
 Per pt dx by her gyn Sent for lab results  disc imp of low glycemic diet and wt loss to prevent DM2

## 2024-07-18 NOTE — Patient Instructions (Signed)
 I'm glad the headache is better  Keep up fluids  Avoid excessive caffeine  Try to go to bed and get up around the same time daily   If this headache cycle returns please let us  know   Continue treating your allergies     I put the referral in for the [facility]  Please let us  know if you don't hear in 1-2 weeks to set that up (mychart message or call or letter)   It will take a while to get in

## 2024-07-18 NOTE — Progress Notes (Signed)
 Subjective:    Patient ID: Hannah Walton, female    DOB: May 14, 1981, 43 y.o.   MRN: [id number]  HPI  Wt Readings from Last 3 Encounters:  07/18/24 235 lb (106.6 kg)  07/03/24 235 lb (106.6 kg)  11/16/23 236 lb 12.8 oz (107.4 kg)   41.63 kg/m  Vitals:   07/18/24 0900  BP: 132/84  Pulse: 71  Temp: 98.1 F (36.7 C)  SpO2: 100%    Pt presents with c/o  Headaches  Obesity  Prediabetes/new dx   Was seen at [facility] ER on 9/24 for acute headache  Noted UC visit prior and had toradol  and imitrex -not helpful    Was given ketorolac , diphenhydratmine, prochloroperazine , dexamethasone  and fluids  Had CT head  CT Head Wo Contrast Result Date: 07/04/2024 CLINICAL DATA:  Headache, sudden, severe. Ambualtory to triage with complaints of worsening headache x2 days. Patient states she was seen at [facility] and given 2 shots, but they did not help. EXAM: CT HEAD WITHOUT CONTRAST TECHNIQUE: Contiguous axial images were obtained from the base of the skull through the vertex without intravenous contrast. RADIATION DOSE REDUCTION: This exam was performed according to the departmental dose-optimization program which includes automated exposure control, adjustment of the mA and/or kV according to patient size and/or use of iterative reconstruction technique. COMPARISON:  None Available. FINDINGS: Brain: No evidence of large-territorial acute infarction. No parenchymal hemorrhage. No mass lesion. No extra-axial collection. No mass effect or midline shift. No hydrocephalus. Basilar cisterns are patent. Vascular: No hyperdense vessel. Skull: No acute fracture or focal lesion. Sinuses/Orbits: Paranasal sinuses and mastoid air cells are clear. The orbits are unremarkable. Other: None. IMPRESSION: No acute intracranial abnormality. Electronically Signed   By: Morgane  Naveau M.D.   On: 07/04/2024 00:46   Labs Lab Results  Component Value Date   NA 132 (L) 07/03/2024   K 3.7 07/03/2024   CO2 24  07/03/2024   GLUCOSE 96 07/03/2024   BUN 9 07/03/2024   CREATININE 0.77 07/03/2024   CALCIUM 8.5 (L) 07/03/2024   GFR 131.51 03/16/2020   EGFR 105.0 02/02/2024   GFRNONAA >60 07/03/2024   Lab Results  Component Value Date   WBC 7.3 07/03/2024   HGB 10.9 (L) 07/03/2024   HCT 34.1 (L) 07/03/2024   MCV 81.8 07/03/2024   PLT 250 07/03/2024    Today headache lingers just a little bit  Starts one side and goes to both  No aura  Had throbbing- better now   Dealing with eye problems- eyedrops   History of chronic daily headache in past Gabapentin  helped some in past Prednisone  taper last given 2023 for this  Has flexeril    Sees oph for swelling around eyes  Did prenisolone drop and also azelastine  Told to take zyrtec also  Is better    Thinks that weight makes headaches worse  Frustrated by it  Is walking more   Trying to eat a little better  Decreasing bread  Likes cheese  Not a lot of sweets   Was dx with prediabetes from gyn       Patient Active Problem List   Diagnosis Date Noted   Morbid obesity (HCC) 07/18/2024   Prediabetes 07/18/2024   Mid back pain 11/07/2022   GERD (gastroesophageal reflux disease) 08/25/2022   Paresthesia of foot 02/03/2022   Left foot pain 02/03/2022   Paresthesia of hand 01/17/2021   S/P cesarean section 10/18/2020   Indication for care in labor and delivery, antepartum 10/16/2020  Generalized abdominal pain 03/16/2020   Elevated BP without diagnosis of hypertension 05/12/2019   Headache 05/12/2019   Paresthesia of left arm 05/11/2017   Amenorrhea 11/26/2015   Encounter for routine gynecological examination 05/16/2015   Other constipation 03/07/2014   Low back pain 12/17/2006   Past Medical History:  Diagnosis Date   Cyst of knee joint 10/2014   left   Cyst of left knee joint 10/27/2014   History of gastric ulcer    Sinus headache    Synovitis of knee 10/2014   left   Past Surgical History:  Procedure Laterality  Date   CESAREAN SECTION N/A 10/17/2020   Procedure: CESAREAN SECTION;  Surgeon: Delana Ted Morrison, DO;  Location: [facility];  Service: Obstetrics;  Laterality: N/A;   KNEE ARTHROSCOPY Left 10/27/2014   Procedure: LEFT KNEE SCOPE SYNOVECTOMY;  Surgeon: Fonda SHAUNNA Olmsted, MD;  Location: [facility];  Service: Orthopedics;  Laterality: Left;   MASS EXCISION Left 10/27/2014   Procedure: LEFT PCL CYST EXCISION ;  Surgeon: Fonda SHAUNNA Olmsted, MD;  Location: [facility];  Service: Orthopedics;  Laterality: Left;   UPPER GI ENDOSCOPY     Social History   Tobacco Use   Smoking status: Never   Smokeless tobacco: Never  Vaping Use   Vaping status: Never Used  Substance Use Topics   Alcohol use: No    Alcohol/week: 0.0 standard drinks of alcohol   Drug use: No   Family History  Problem Relation Age of Onset   Arthritis Mother    Diabetes Maternal Grandmother    Arthritis Paternal Grandmother    Stroke Father    Stroke Paternal Uncle    Diabetes Paternal Uncle    Diabetes Maternal Uncle    Stroke Maternal Uncle    Allergies  Allergen Reactions   Cephalexin     headache   Current Outpatient Medications on File Prior to Visit  Medication Sig Dispense Refill   azelastine (OPTIVAR) 0.05 % ophthalmic solution Apply 1 drop to eye 2 (two) times daily.     Cholecalciferol (VITAMIN D3) 1.25 MG (50000 UT) CAPS Take 1 capsule by mouth once a week.     cyclobenzaprine  (FLEXERIL ) 10 MG tablet Take one po at bedtime prn muscle spasm 30 tablet 0   JUNEL FE 1/20 1-20 MG-MCG tablet Take 1 tablet by mouth daily.     prednisoLONE acetate (PRED FORTE) 1 % ophthalmic suspension Place 1 drop into both eyes in the morning, at noon, and at bedtime.     No current facility-administered medications on file prior to visit.    Review of Systems  Constitutional:  Positive for fatigue. Negative for activity change, appetite change, fever and unexpected weight change.  HENT:  Negative  for congestion, ear pain, rhinorrhea, sinus pressure and sore throat.   Eyes:  Negative for pain, redness and visual disturbance.  Respiratory:  Negative for cough, shortness of breath and wheezing.   Cardiovascular:  Negative for chest pain and palpitations.  Gastrointestinal:  Negative for abdominal pain, blood in stool, constipation and diarrhea.  Endocrine: Negative for polydipsia and polyuria.  Genitourinary:  Negative for dysuria, frequency and urgency.  Musculoskeletal:  Negative for arthralgias, back pain and myalgias.  Skin:  Negative for pallor and rash.  Allergic/Immunologic: Negative for environmental allergies.  Neurological:  Positive for headaches. Negative for dizziness and syncope.  Hematological:  Negative for adenopathy. Does not bruise/bleed easily.  Psychiatric/Behavioral:  Negative for decreased concentration and dysphoric  mood. The patient is not nervous/anxious.        Objective:   Physical Exam Constitutional:      General: She is not in acute distress.    Appearance: She is well-developed. She is obese.  HENT:     Head: Normocephalic and atraumatic.     Right Ear: External ear normal.     Left Ear: External ear normal.     Nose: Nose normal.     Mouth/Throat:     Pharynx: No oropharyngeal exudate.  Eyes:     General: No scleral icterus.       Right eye: No discharge.        Left eye: No discharge.     Conjunctiva/sclera: Conjunctivae normal.     Pupils: Pupils are equal, round, and reactive to light.     Comments: No nystagmus  Neck:     Thyroid : No thyromegaly.     Vascular: No carotid bruit or JVD.     Trachea: No tracheal deviation.  Cardiovascular:     Rate and Rhythm: Normal rate and regular rhythm.     Heart sounds: Normal heart sounds. No murmur heard. Pulmonary:     Effort: Pulmonary effort is normal. No respiratory distress.     Breath sounds: Normal breath sounds. No wheezing or rales.  Abdominal:     General: Bowel sounds are normal.  There is no distension.     Palpations: Abdomen is soft. There is no mass.     Tenderness: There is no abdominal tenderness.  Musculoskeletal:        General: No tenderness.     Cervical back: Full passive range of motion without pain, normal range of motion and neck supple.  Lymphadenopathy:     Cervical: No cervical adenopathy.  Skin:    General: Skin is warm and dry.     Coloration: Skin is not pale.     Findings: No rash.  Neurological:     Mental Status: She is alert and oriented to person, place, and time.     Cranial Nerves: No cranial nerve deficit, dysarthria or facial asymmetry.     Sensory: No sensory deficit.     Motor: No weakness, tremor, atrophy or abnormal muscle tone.     Coordination: Coordination normal.     Gait: Gait normal.     Deep Tendon Reflexes: Reflexes are normal and symmetric. Reflexes normal.     Comments: No focal cerebellar signs   Psychiatric:        Mood and Affect: Mood normal.        Behavior: Behavior normal.        Thought Content: Thought content normal.           Assessment & Plan:   Problem List Items Addressed This Visit       Other   Prediabetes   Per pt dx by her gyn Sent for lab results  disc imp of low glycemic diet and wt loss to prevent DM2       Relevant Orders   Amb Ref to Medical Weight Management   Morbid obesity (HCC)   Per pt-dx with prediabetes  BMI 41 Has problems controlling bread intake  Also fixing separate meals for family members Also finding time for food prep and exercise   Did see nutritionist (ref by gyn) Discussed option of healthy weight and wellness center with team approach  Referral done  Encouraged to eat less processed foods and trouble shoot exercise  plan Try to get most of your carbohydrates from produce (with the exception of white potatoes) and whole grains Eat less bread/pasta/rice/snack foods/cereals/sweets and other items from the middle of the grocery store (processed carbs)        Relevant Orders   Amb Ref to Medical Weight Management   Headache - Primary   Recent headache cycle was hard to break but now much improved UC visit- toradol  and imitrex  (headache came back) Then ER visit [facility] Reviewed hospital records, lab results and studies in detail  Improved with toradol , procholroperazine, dexamethasone   CT head reassuring  Feeling better now  Discussed headache triggers and habits Migraine features w/o aura  In past gabapentin  did not help  Tolerating OC and blood pressure is stable   Will continue to monitor  If recurrent may need to consider proph and /or referral to neuroloty

## 2024-07-18 NOTE — Assessment & Plan Note (Signed)
 Per pt-dx with prediabetes  BMI 41 Has problems controlling bread intake  Also fixing separate meals for family members Also finding time for food prep and exercise   Did see nutritionist (ref by gyn) Discussed option of healthy weight and wellness center with team approach  Referral done  Encouraged to eat less processed foods and trouble shoot exercise plan Try to get most of your carbohydrates from produce (with the exception of white potatoes) and whole grains Eat less bread/pasta/rice/snack foods/cereals/sweets and other items from the middle of the grocery store (processed carbs)

## 2024-07-18 NOTE — Assessment & Plan Note (Signed)
 Recent headache cycle was hard to break but now much improved UC visit- toradol  and imitrex  (headache came back) Then ER visit [facility] Reviewed hospital records, lab results and studies in detail  Improved with toradol , procholroperazine, dexamethasone   CT head reassuring  Feeling better now  Discussed headache triggers and habits Migraine features w/o aura  In past gabapentin  did not help  Tolerating OC and blood pressure is stable   Will continue to monitor  If recurrent may need to consider proph and /or referral to neuroloty

## 2024-07-22 ENCOUNTER — Encounter: Payer: Self-pay | Admitting: Family Medicine

## 2024-07-25 ENCOUNTER — Institutional Professional Consult (permissible substitution) (INDEPENDENT_AMBULATORY_CARE_PROVIDER_SITE_OTHER): Admitting: Nurse Practitioner
